# Patient Record
Sex: Male | Born: 1985 | Race: White | Hispanic: No | Smoking: Never smoker
Health system: Southern US, Community
[De-identification: ages and names within clinical notes are randomized; demographics above are authoritative.]

## PROBLEM LIST (undated history)

## (undated) DIAGNOSIS — F32A Depression, unspecified: Secondary | ICD-10-CM

## (undated) DIAGNOSIS — F329 Major depressive disorder, single episode, unspecified: Secondary | ICD-10-CM

## (undated) DIAGNOSIS — F840 Autistic disorder: Secondary | ICD-10-CM

## (undated) DIAGNOSIS — R569 Unspecified convulsions: Secondary | ICD-10-CM

## (undated) DIAGNOSIS — R269 Unspecified abnormalities of gait and mobility: Secondary | ICD-10-CM

## (undated) DIAGNOSIS — Z993 Dependence on wheelchair: Secondary | ICD-10-CM

## (undated) DIAGNOSIS — J302 Other seasonal allergic rhinitis: Secondary | ICD-10-CM

## (undated) DIAGNOSIS — K5900 Constipation, unspecified: Secondary | ICD-10-CM

## (undated) DIAGNOSIS — F8189 Other developmental disorders of scholastic skills: Secondary | ICD-10-CM

## (undated) DIAGNOSIS — F419 Anxiety disorder, unspecified: Secondary | ICD-10-CM

## (undated) HISTORY — DX: Unspecified convulsions: R56.9

## (undated) HISTORY — PX: CIRCUMCISION: SUR203

---
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
## 2012-12-27 ENCOUNTER — Other Ambulatory Visit: Payer: Self-pay

## 2012-12-27 DIAGNOSIS — G40209 Localization-related (focal) (partial) symptomatic epilepsy and epileptic syndromes with complex partial seizures, not intractable, without status epilepticus: Secondary | ICD-10-CM

## 2012-12-27 MED ORDER — CARBATROL 300 MG PO CP12: ORAL_CAPSULE | ORAL | Status: DC

## 2012-12-27 MED ORDER — CLONIDINE HCL 0.1 MG PO TABS: ORAL_TABLET | ORAL | Status: DC

## 2012-12-27 MED ORDER — CARBAMAZEPINE ER 300 MG PO CP12: ORAL_CAPSULE | ORAL | Status: DC

## 2012-12-27 NOTE — Telephone Encounter (Signed)
Please let him know that generic Carbatrol is ok if that is what the patient has been taking. I have updated the chart. Thanks, Inetta Fermo

## 2012-12-27 NOTE — Telephone Encounter (Signed)
I called and informed [name] that it was ok.

## 2012-12-27 NOTE — Telephone Encounter (Signed)
[name], the pharmacist from [facility], lvm stating that pt received the generic brand of Carbatrol last month. He said that if provider wants pt to get the brand name it needs to be hand written on the Rx. He would like a call back to clarify at [phone].

## 2012-12-27 NOTE — Addendum Note (Signed)
Addended by: Princella Ion on: 12/27/2012 11:47 AM   Modules accepted: Orders

## 2013-05-30 ENCOUNTER — Other Ambulatory Visit: Payer: Self-pay

## 2013-05-30 DIAGNOSIS — G40209 Localization-related (focal) (partial) symptomatic epilepsy and epileptic syndromes with complex partial seizures, not intractable, without status epilepticus: Secondary | ICD-10-CM

## 2013-05-30 MED ORDER — CARBAMAZEPINE ER 300 MG PO CP12: ORAL_CAPSULE | ORAL | Status: DC

## 2013-06-09 ENCOUNTER — Other Ambulatory Visit: Payer: Self-pay

## 2013-06-09 DIAGNOSIS — G40209 Localization-related (focal) (partial) symptomatic epilepsy and epileptic syndromes with complex partial seizures, not intractable, without status epilepticus: Secondary | ICD-10-CM

## 2013-06-09 MED ORDER — SERTRALINE HCL 100 MG PO TABS: ORAL_TABLET | ORAL | Status: DC

## 2013-06-09 MED ORDER — CLONIDINE HCL 0.1 MG PO TABS: ORAL_TABLET | ORAL | Status: DC

## 2013-07-12 ENCOUNTER — Other Ambulatory Visit: Payer: Self-pay

## 2013-07-12 DIAGNOSIS — G40209 Localization-related (focal) (partial) symptomatic epilepsy and epileptic syndromes with complex partial seizures, not intractable, without status epilepticus: Secondary | ICD-10-CM

## 2013-07-12 MED ORDER — SERTRALINE HCL 100 MG PO TABS: ORAL_TABLET | ORAL | Status: DC

## 2013-07-12 MED ORDER — CLONIDINE HCL 0.1 MG PO TABS: ORAL_TABLET | ORAL | Status: DC

## 2013-07-26 DIAGNOSIS — F840 Autistic disorder: Secondary | ICD-10-CM

## 2013-07-26 DIAGNOSIS — Z79899 Other long term (current) drug therapy: Secondary | ICD-10-CM

## 2013-07-26 DIAGNOSIS — G40209 Localization-related (focal) (partial) symptomatic epilepsy and epileptic syndromes with complex partial seizures, not intractable, without status epilepticus: Secondary | ICD-10-CM

## 2013-07-26 DIAGNOSIS — R269 Unspecified abnormalities of gait and mobility: Secondary | ICD-10-CM | POA: Insufficient documentation

## 2013-08-15 ENCOUNTER — Other Ambulatory Visit: Payer: Self-pay

## 2013-08-15 DIAGNOSIS — G40209 Localization-related (focal) (partial) symptomatic epilepsy and epileptic syndromes with complex partial seizures, not intractable, without status epilepticus: Secondary | ICD-10-CM

## 2013-08-16 ENCOUNTER — Ambulatory Visit (INDEPENDENT_AMBULATORY_CARE_PROVIDER_SITE_OTHER): Payer: Medicaid Other | Admitting: Pediatrics

## 2013-08-16 ENCOUNTER — Encounter: Payer: Self-pay | Admitting: Pediatrics

## 2013-08-16 VITALS — BP 100/70 | HR 96 | Ht 67.25 in | Wt 117.8 lb

## 2013-08-16 DIAGNOSIS — F840 Autistic disorder: Secondary | ICD-10-CM

## 2013-08-16 DIAGNOSIS — G40209 Localization-related (focal) (partial) symptomatic epilepsy and epileptic syndromes with complex partial seizures, not intractable, without status epilepticus: Secondary | ICD-10-CM

## 2013-08-16 DIAGNOSIS — F411 Generalized anxiety disorder: Secondary | ICD-10-CM

## 2013-08-16 MED ORDER — CLONIDINE HCL 0.1 MG PO TABS: ORAL_TABLET | ORAL | Status: DC

## 2013-08-16 MED ORDER — SERTRALINE HCL 100 MG PO TABS: ORAL_TABLET | ORAL | Status: DC

## 2013-08-16 MED ORDER — CARBAMAZEPINE ER 300 MG PO CP12: ORAL_CAPSULE | ORAL | Status: DC

## 2013-08-16 NOTE — Progress Notes (Signed)
Patient: Roberto GarlandMitchell Danowski MRN: [id number] Sex: male DOB: [birth year]  Provider: Deetta PerlaHICKLING,Quanetta Truss H, MD [facility]  Note type: Routine return visit  History of Present Illness: Referral Source: Mauricio Poegina York, NP [facility] Chief Complaint: Seizures/Autism  Roberto Ramirez is a 28 y.o. male who returns for evaluation of management of autism and complex partial seizures.  The patient returns on August 16, 2013, for the first time since May 10, 2012.  He has complex partial seizures and undifferentiated autism.  He has taken and tolerated Carbatrol well.  He attends school called [employer] where he has one to one Merchandiser, retailCAPS worker.  [place]  He is there six hours a day.  He has good appetite, fairly normal sleep patterns.  His health has been good.  He has environmental allergies.  He is on a variety of medications to deal with anxiety and changes in mood.  He is also on a large variety of multivitamins.  He has constipation treated with MiraLax and the acne treated with Differin.  His family has difficulty getting him to drink fluid.  He is quite thin and I suspect has fairly limited appetite.  The patient enjoys playing with an iPad with visually stimulating applications.  He has little to no language.  Though he has problems with anxiety with transitions he has not been physically aggressive.  He enjoys repetitive activities such as opening and closing doors and turning on and off lights.  There have been no substantial changes in his mood or behavior.  Review of Systems: 12 system review was unremarkable  Past Medical History  Diagnosis Date  . 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  His last known seizure was in 2003.  MRI scan of the brain showed slight decrease in myelinization for age, but no other specific findings of disorders of brain formation.  I do not know where this took place.  Medications in the past have included Depakene, Dilantin, and Diamox before carbamazepine..  Birth History 7 lbs. 9 oz. born at full-term to a 26 year old primigravida after an uncomplicated pregnancy.   Mother had dental films during gestation which was otherwise uncomplicated.   Labor lasted for 26 hours. He had a normal spontaneous vaginal delivery. At birth he had some cold stress but no other history of problems.   He was breast-fed for 8 months.   He had developmental delay in his gross and fine motor milestones but in particular language and socialization.  Behavior History none  Surgical History History reviewed. No pertinent past surgical history.  Family History family history includes Lung cancer in his paternal grandfather. Family History is negative migraines, seizures, cognitive impairment, blindness, deafness, birth defects, chromosomal disorder, autism.  Social History History   Social History  . Marital Status: Unknown    Spouse Name: N/A    Number of Children: N/A  . Years of Education: N/A   Social History Main Topics  . Smoking status: Never Smoker   . Smokeless tobacco: Never Used  . Alcohol Use: No  . Drug Use: No  . Sexual Activity: No   Other Topics Concern  . None   Social History Narrative  .  None   Educational level 12th special education Living with Amy Young his AFL provider  Hobbies/Interest: Enjoys socializing and going for walks outdoors. School comments [employer] graduated with a certificate from [employer] in 2011.  Current Outpatient Prescriptions on File Prior to Visit  Medication Sig Dispense Refill  . carbamazepine (CARBATROL) 300 MG 12 hr capsule Take 2 caps by mouth twice  daily  120 capsule  0  . cloNIDine (CATAPRES) 0.1 MG tablet Take 1/2 tab by mouth three times daily and take 1 tab by mouth every evening at 9:00 pm  75 tablet  5  . sertraline (ZOLOFT) 100 MG tablet Take 1 tab by mouth every morning.  30 tablet  5   No current facility-administered medications on file prior to visit.   The medication list was reviewed and reconciled. All changes or newly prescribed medications were explained.  A complete medication list was provided to the patient/caregiver.  No Known Allergies  Physical Exam BP 100/70  Pulse 96  Ht 5' 7.25" (1.708 m)  Wt 117 lb 12.8 oz (53.434 kg)  BMI 18.32 kg/m2  General: alert, well developed, well nourished, in no acute distress, sandy hair, brown eyes, right handed Head: normocephalic, no dysmorphic features Ears, Nose and Throat: Unable to examine Neck: supple, full range of motion, no cranial or cervical bruits Respiratory: auscultation clear Cardiovascular: no murmurs, pulses are normal Musculoskeletal: no skeletal deformities or apparent scoliosis Skin: no rashes or neurocutaneous lesions  Neurologic Exam  Mental Status: alert; he shows cognitive impairment and limited language, poor eye contact and resists examination.  He was restless and pacing. Cranial Nerves: visual fields are full to double simultaneous stimuli; extraocular movements are full and conjugate; pupils are around reactive to light; funduscopic examination shows positive red reflex; symmetric facial strength; midline tongue and uvula; air conduction is greater than bone conduction bilaterally. Motor: Normal strength, tone and mass; good fine motor movements; no pronator drift. Sensory: intact responses to cold, vibration, proprioception and stereognosis Coordination: He can hold and transfer objects; he has clumsiness in fine motor movements. Gait and Station: Slightly broad-based but stable gait and station: patient is able to walk on heels, toes and  tandem without difficulty; balance is adequate; Romberg exam is negative; Gower response is negative Reflexes: symmetric and diminished bilaterally; no clonus; bilateral flexor plantar responses.  Assessment 1. 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 2. Autism spectrum disorder, 299.00. 3. Anxiety state, 300.00.  Plan I am not going to change any of his medications.  Prescriptions were issued for sertraline, clonidine, and carbamazepine XR.  I will plan to see him in a year, but will see him sooner depending upon clinical need.    I spent 30 minutes of face-to-face time with the patient and his mother more than half of it in consultation.  Deetta Perla MD

## 2013-08-20 ENCOUNTER — Encounter: Payer: Self-pay | Admitting: Pediatrics

## 2013-10-27 ENCOUNTER — Other Ambulatory Visit: Payer: Self-pay

## 2013-10-27 DIAGNOSIS — G40209 Localization-related (focal) (partial) symptomatic epilepsy and epileptic syndromes with complex partial seizures, not intractable, without status epilepticus: Secondary | ICD-10-CM

## 2013-10-27 MED ORDER — CARBAMAZEPINE ER 300 MG PO CP12
ORAL_CAPSULE | ORAL | Status: DC
Start: 2013-10-27 — End: 2014-06-05

## 2014-03-29 ENCOUNTER — Other Ambulatory Visit: Payer: Self-pay | Admitting: Family

## 2014-03-29 DIAGNOSIS — F840 Autistic disorder: Secondary | ICD-10-CM

## 2014-03-29 DIAGNOSIS — F411 Generalized anxiety disorder: Secondary | ICD-10-CM

## 2014-03-29 MED ORDER — CLONIDINE HCL 0.1 MG PO TABS: ORAL_TABLET | ORAL | Status: DC

## 2014-06-05 ENCOUNTER — Other Ambulatory Visit: Payer: Self-pay

## 2014-06-05 DIAGNOSIS — G40209 Localization-related (focal) (partial) symptomatic epilepsy and epileptic syndromes with complex partial seizures, not intractable, without status epilepticus: Secondary | ICD-10-CM

## 2014-06-05 MED ORDER — CARBAMAZEPINE ER 300 MG PO CP12: ORAL_CAPSULE | ORAL | Status: DC

## 2014-09-07 ENCOUNTER — Other Ambulatory Visit: Payer: Self-pay

## 2014-09-07 DIAGNOSIS — G40209 Localization-related (focal) (partial) symptomatic epilepsy and epileptic syndromes with complex partial seizures, not intractable, without status epilepticus: Secondary | ICD-10-CM

## 2014-09-07 MED ORDER — CARBAMAZEPINE ER 300 MG PO CP12: ORAL_CAPSULE | ORAL | Status: DC

## 2014-10-02 ENCOUNTER — Other Ambulatory Visit: Payer: Self-pay

## 2014-10-02 DIAGNOSIS — F411 Generalized anxiety disorder: Secondary | ICD-10-CM

## 2014-10-02 DIAGNOSIS — F840 Autistic disorder: Secondary | ICD-10-CM

## 2014-10-02 MED ORDER — CLONIDINE HCL 0.1 MG PO TABS: ORAL_TABLET | ORAL | Status: DC

## 2014-10-18 ENCOUNTER — Ambulatory Visit (INDEPENDENT_AMBULATORY_CARE_PROVIDER_SITE_OTHER): Payer: Medicaid Other | Admitting: Pediatrics

## 2014-10-18 ENCOUNTER — Encounter: Payer: Self-pay | Admitting: Pediatrics

## 2014-10-18 VITALS — BP 106/70 | HR 84 | Ht 67.0 in | Wt 117.0 lb

## 2014-10-18 DIAGNOSIS — F840 Autistic disorder: Secondary | ICD-10-CM | POA: Diagnosis not present

## 2014-10-18 DIAGNOSIS — G40209 Localization-related (focal) (partial) symptomatic epilepsy and epileptic syndromes with complex partial seizures, not intractable, without status epilepticus: Secondary | ICD-10-CM | POA: Diagnosis not present

## 2014-10-18 DIAGNOSIS — R269 Unspecified abnormalities of gait and mobility: Secondary | ICD-10-CM | POA: Diagnosis not present

## 2014-10-18 DIAGNOSIS — F802 Mixed receptive-expressive language disorder: Secondary | ICD-10-CM | POA: Diagnosis not present

## 2014-10-18 DIAGNOSIS — F411 Generalized anxiety disorder: Secondary | ICD-10-CM

## 2014-10-18 MED ORDER — CARBAMAZEPINE ER 300 MG PO CP12: ORAL_CAPSULE | ORAL | Status: DC

## 2014-10-18 MED ORDER — CLONIDINE HCL 0.1 MG PO TABS: ORAL_TABLET | ORAL | Status: DC

## 2014-10-18 MED ORDER — SERTRALINE HCL 100 MG PO TABS: ORAL_TABLET | ORAL | Status: DC

## 2014-10-18 NOTE — Progress Notes (Signed)
Patient: Roberto Ramirez MRN: [id number] Sex: male DOB: Dec 16, 1985  Provider: Deetta PerlaHICKLING,Rock Sobol H, MD [facility]  Note type: Routine return visit  History of Present Illness: Referral Source: Mauricio Poegina York, NP [facility]: [facility] Chief Complaint: Seizures/Autism Spectrum Disorder  Roberto Ramirez is a 29 y.o. male who was evaluated on October 18, 2014, for the first time since August 16, 2013.  He has autism spectrum disorder with intellectual and language impairment.  He has a history of well-controlled complex partial seizures and is taken and tolerated Carbatrol without significant side effects.  He attends an adult day care center called [employer] and has one to one CAPS worker.  He is there for about six hours a day.  Many of the goals at this center are self-help skills including brushing his teeth, feeding himself with a spoon and a fork open cup and helping to dress himself.  He needs help with hygiene.  He was here today with his guardian.  I think the only medications that he is taking that I prescribe are Carbatrol and clonidine.  He has been given clonazepam when he shows increased anxiety, which has caused him to at times act strange and be unable to walk.  Review of Systems: 12 system review was remarkable for anxiety  Past Medical History Diagnosis Date  . Seizures    Hospitalizations: No., Head Injury: No., Nervous System Infections: No., Immunizations up to date: Yes.    ER visit during the summer of 2015 due to anxiety attack.   Evaluation in the past includes routine karyotype 8046 XY, negative fragile X syndrome by molecular PCR, normal TSH, and normal CT scan of the brain July 21, 2000. His last known seizure was in 2003. MRI scan of the brain showed slight decrease in myelinization for age, but no other specific findings of disorders of brain formation. I do not know where this took place. Medications in  the past have included Depakene, Dilantin, and Diamox before carbamazepine.  Birth History 7 lbs. 9 oz. born at full-term to a 27 year old primigravida after an uncomplicated pregnancy.  Mother had dental films during gestation which was otherwise uncomplicated.  Labor lasted for 26 hours. He had a normal spontaneous vaginal delivery. At birth he had some cold stress but no other history of problems.  He was breast-fed for 8 months.  He had developmental delay in his gross and fine motor milestones but in particular language and socialization.  Behavior History none  Surgical History Procedure Laterality Date  . Circumcision  1989   Family History family history includes Cancer in his maternal grandmother; Lung cancer in his paternal grandfather. Family history is negative for migraines, seizures, intellectual disabilities, blindness, deafness, birth defects, chromosomal disorder, or autism.  Social History . Marital Status: Unknown    Spouse Name: N/A  . Number of Children: N/A  . Years of Education: N/A   Social History Main Topics  . Smoking status: Never Smoker   . Smokeless tobacco: Never Used  . Alcohol Use: No  . Drug Use: No  . Sexual Activity: No   Social History Narrative  Educational level special education Living with Amy Young his legal guardian   Hobbies/Interest: Enjoys walking, kickball and swimming.   No Known Allergies  Physical Exam BP 106/70 mmHg  Pulse 84  Ht 5\' 7"  (1.702 m)  Wt 117 lb (53.071 kg)  BMI 18.32 kg/m2  General: alert, well developed, well nourished, in  no acute distress, sandy hair, brown eyes, right handed Head: normocephalic, no dysmorphic features Ears, Nose and Throat: Unable to examine Neck: supple, full range of motion, no cranial or cervical bruits Respiratory: auscultation clear Cardiovascular: no murmurs, pulses are normal Musculoskeletal: no skeletal deformities or apparent scoliosis Skin: no rashes or  neurocutaneous lesions  Neurologic Exam  Mental Status: alert; he shows intellectual disability and limited language, poor eye contact and resists examination. He was restless and pacing. Cranial Nerves: visual fields are full to double simultaneous stimuli; extraocular movements are full and conjugate; pupils are round reactive to light; funduscopic examination shows positive red reflex; symmetric facial strength; midline tongue and uvula; air conduction is greater than bone conduction bilaterally. Motor: Normal strength, tone and mass; good fine motor movements; no pronator drift. Sensory: intact responses to cold, vibration, proprioception and stereognosis Coordination: He can hold and transfer objects; he has clumsiness in fine motor movements. Gait and Station: Slightly broad-based but stable gait and station: patient is able to walk on heels, toes and tandem without difficulty; balance is adequate; Romberg exam is negative; Gower response is negative Reflexes: symmetric and diminished bilaterally; no clonus; bilateral flexor plantar responses.  Assessment 1. Partial epilepsy with impairment of consciousness, G40.209. 2. Autism spectrum disorder with accompanying intellectual impairment, requiring substantial support (level 2), F84.0. 3. Receptive-expressive language disorder, F80.2. 4. Abnormality of gait, R26.9. 5. Anxiety state, F41.1.  Discussion The patient is physically and neurologically stable.  I am pleased that his seizures are under control.  I think that it has been difficult to modify his behaviors.  I think that the best influence upon him is his adult daycare center.  Plan I refilled prescriptions for extended release carbamazepine.  I did not write prescriptions for clonidine or sertraline.  Overall, I am pleased with his health and the fact that his development has been stable.  He will return to see me in a year.  I will make certain that he has a prescription refills  in the interim.  I spent 30 minutes of face-to-face time with the patient and his caregiver, more than half was in consultation.   Medication List   This list is accurate as of: 10/18/14 12:23 PM.       adapalene 0.1 % cream  Commonly known as:  DIFFERIN  Apply topically at bedtime.     b complex vitamins capsule  Take 1 capsule by mouth daily.     busPIRone 10 MG tablet  Commonly known as:  BUSPAR  Take 10 mg by mouth 2 (two) times daily.     carbamazepine 300 MG 12 hr capsule  Commonly known as:  CARBATROL  Take 2 caps by mouth twice daily     cetirizine 10 MG chewable tablet  Commonly known as:  ZYRTEC  Chew 10 mg by mouth daily.     clonazePAM 0.5 MG tablet  Commonly known as:  KLONOPIN  Take 0.5 mg by mouth 3 (three) times daily. Take 1/2 tab by mouth TID.     cloNIDine 0.1 MG tablet  Commonly known as:  CATAPRES  Take 1/2 tab by mouth three times daily and take 1 tab by mouth every evening at 9:00 pm     fluticasone 27.5 MCG/SPRAY nasal spray  Commonly known as:  VERAMYST  Place 2 sprays into the nose daily. Each nostril     Lecithin 400 MG Caps  Take 1 capsule by mouth daily.     multivitamin tablet  Take  1 tablet by mouth daily.     polyethylene glycol packet  Commonly known as:  MIRALAX / GLYCOLAX  Take 17 g by mouth daily.     sertraline 100 MG tablet  Commonly known as:  ZOLOFT  Take 1 1/2 tab by mouth every morning.      The medication list was reviewed and reconciled. All changes or newly prescribed medications were explained.  A complete medication list was provided to the patient/caregiver.  Deetta Perla MD

## 2015-05-15 ENCOUNTER — Other Ambulatory Visit: Payer: Self-pay

## 2015-05-15 DIAGNOSIS — G40209 Localization-related (focal) (partial) symptomatic epilepsy and epileptic syndromes with complex partial seizures, not intractable, without status epilepticus: Secondary | ICD-10-CM

## 2015-05-15 MED ORDER — CARBAMAZEPINE ER 300 MG PO CP12: ORAL_CAPSULE | ORAL | Status: DC

## 2015-06-11 ENCOUNTER — Other Ambulatory Visit: Payer: Self-pay | Admitting: Family

## 2015-06-11 ENCOUNTER — Other Ambulatory Visit: Payer: Self-pay

## 2015-06-11 DIAGNOSIS — F411 Generalized anxiety disorder: Secondary | ICD-10-CM

## 2015-06-11 DIAGNOSIS — G40209 Localization-related (focal) (partial) symptomatic epilepsy and epileptic syndromes with complex partial seizures, not intractable, without status epilepticus: Secondary | ICD-10-CM

## 2015-06-11 DIAGNOSIS — F840 Autistic disorder: Secondary | ICD-10-CM

## 2015-06-11 MED ORDER — CARBAMAZEPINE ER 300 MG PO CP12: ORAL_CAPSULE | ORAL | Status: DC

## 2015-06-11 MED ORDER — CLONIDINE HCL 0.1 MG PO TABS: ORAL_TABLET | ORAL | Status: DC

## 2015-11-14 ENCOUNTER — Other Ambulatory Visit: Payer: Self-pay | Admitting: Family

## 2015-11-19 ENCOUNTER — Encounter: Payer: Self-pay | Admitting: Pediatrics

## 2015-11-19 ENCOUNTER — Ambulatory Visit (INDEPENDENT_AMBULATORY_CARE_PROVIDER_SITE_OTHER): Payer: Medicaid Other | Admitting: Pediatrics

## 2015-11-19 ENCOUNTER — Other Ambulatory Visit: Payer: Self-pay | Admitting: Family

## 2015-11-19 VITALS — BP 90/70 | HR 72 | Ht 67.0 in | Wt 121.2 lb

## 2015-11-19 DIAGNOSIS — F840 Autistic disorder: Secondary | ICD-10-CM

## 2015-11-19 DIAGNOSIS — G40209 Localization-related (focal) (partial) symptomatic epilepsy and epileptic syndromes with complex partial seizures, not intractable, without status epilepticus: Secondary | ICD-10-CM

## 2015-11-19 DIAGNOSIS — R269 Unspecified abnormalities of gait and mobility: Secondary | ICD-10-CM

## 2015-11-19 DIAGNOSIS — F802 Mixed receptive-expressive language disorder: Secondary | ICD-10-CM

## 2015-11-19 MED ORDER — CLONIDINE HCL 0.1 MG PO TABS: ORAL_TABLET | ORAL | Status: DC

## 2015-11-19 MED ORDER — CARBAMAZEPINE ER 300 MG PO CP12: 600.0000 mg | ORAL_CAPSULE | Freq: Two times a day (BID) | ORAL | Status: DC

## 2015-11-19 NOTE — Progress Notes (Signed)
Patient: Roberto Ramirez MRN: [id number] Sex: male DOB: [birth year]  Provider: Deetta Perla, MD Location of Care: [facility]  Note type: Routine return visit  History of Present Illness: Referral Source: Mauricio Po, NP  History from: mother, patient and [facility] chart Chief Complaint: Seizures/Autism Spectrum Disorder  Roberto Ramirez is a 30 y.o. male who returns Nov 19, 2015, for the first time since October 18, 2014.  He has autism spectrum disorder with intellectual and language impairment.  He has well controlled complex partial seizures.  He takes and tolerates Carbatrol without significant side effects.  He lives with an associate family life person, Ardeth Perfect, his mother remains his guardian, but has limited contact with him.  He attended an adult daycare center called [employer] with the one-to-one CAPS worker six hours a day.  In the 13 months since I have seen him, he has been stable.  There have been no seizures.  His health is good.  He has normal sleeping habits.  He goes to breakfast with his AFL every morning.  He picks up the restaurant.  As regards to activities of daily living, he is toilet trained to some extent and is beginning to vocalize his need to use the bathroom.  He feeds himself.  He is dependent on her for dressing and undressing and for appropriate hygiene.  He goes to bed around 9:45 p.m. and has occasional arousals at nighttime and he gets up at 5 or 6 a.m.  He does not take midday naps.  Clonazepam has seemed to improve his anxiety and agitation and fortunately he has not become tolerant to it.  I prescribe extended release carbamazepine and also clonidine throughout the day, which also helps his anxiety and agitation. . Review of Systems: 12 system review was assessed and was negative  Past Medical History Diagnosis Date  . Seizures (HCC)    Hospitalizations: No., Head Injury: No., Nervous System Infections: No., Immunizations up to  date: Yes.    ER visit during the summer of 2015 due to anxiety attack.   Evaluation in the past includes routine karyotype 28 XY, negative fragile X syndrome by molecular PCR, normal TSH, and normal CT scan of the brain July 21, 2000. His last known seizure was in 2003. MRI scan of the brain showed slight decrease in myelinization for age, but no other specific findings of disorders of brain formation. I do not know where this took place. Medications in the past have included Depakene, Dilantin, and Diamox before carbamazepine.  Birth History 7 lbs. 9 oz. born at full-term to a 28 year old primigravida after an uncomplicated pregnancy.  Mother had dental films during gestation which was otherwise uncomplicated.  Labor lasted for 26 hours. He had a normal spontaneous vaginal delivery. At birth he had some cold stress but no other history of problems.  He was breast-fed for 8 months.  He had developmental delay in his gross and fine motor milestones but in particular language and socialization.  Behavior History autism spectrum disorder  Surgical History Procedure Laterality Date  . Circumcision  1989   Family History family history includes Cancer in his maternal grandmother; Lung cancer in his paternal grandfather. Family history is negative for migraines, seizures, intellectual disabilities, blindness, deafness, birth defects, chromosomal disorder, or autism.  Social History . Marital Status: Unknown    Spouse Name: N/A  . Number of Children: N/A  . Years of Education: N/A   Social History Main Topics  . Smoking  status: Never Smoker   . Smokeless tobacco: Never Used  . Alcohol Use: No  . Drug Use: No  . Sexual Activity: No   Social History Narrative  Lives with an AFL- Amy Young.  Attends an adult day care. [employer] in [place], 6 hours daily Monday through Friday.  No Known Allergies  Physical Exam BP 90/70 mmHg  Pulse 72  Ht  (1.702 m)  Wt  121 lb 3.2 oz (54.976 kg)  BMI 18.98 kg/m2  General: alert, well developed, well nourished, in no acute distress, sandy hair, brown eyes, right handed Head: normocephalic, no dysmorphic features Ears, Nose and Throat: Unable to examine Neck: supple, full range of motion, no cranial or cervical bruits Respiratory: auscultation clear Cardiovascular: no murmurs, pulses are normal Musculoskeletal: no skeletal deformities or apparent scoliosis Skin: no rashes or neurocutaneous lesions  Neurologic Exam  Mental Status: alert; he shows intellectual disability and limited language, poor eye contact and has difficulty following commands. He was able to sit in a chair during history taking without much difficulty.  He got up once to leave the room, but was able to be redirected and was fine. Cranial Nerves: visual fields are full to double simultaneous stimuli; extraocular movements are full and conjugate; pupils are round reactive to light; funduscopic examination shows positive red reflex; symmetric facial strength; midline tongue and uvula; air conduction is greater than bone conduction bilaterally. Motor: Normal functional strength, tone and mass; clumsy fine motor movements; can't test pronator drift. Sensory: intact responses to cold, vibration, proprioception and stereognosis Coordination: He can hold and transfer objects Gait and Station: Slightly broad-based but stable gait and station: patient is able to walk on heels, toes and tandem without difficulty; balance is adequate; Romberg exam is negative; Gower response is negative Reflexes: symmetric and diminished bilaterally; no clonus; bilateral flexor plantar responses  Assessment 1. Partial epilepsy with impairment of consciousness, G40.209. 2. Autism spectrum disorder with accompanying intellectual impairment requiring substantial support (level 2), F84.0. 3. Receptive-expressive language disorder, F80.2. 4. Abnormality of gait,  R26.9.  Discussion [name] is stable.  There is no reason to make any change in his current treatment.  Plan I refilled prescriptions for extended release carbamazepine and clonidine.  He will return to see me in one year.  I spent 30 minutes of face-to-face time with Roberto Ramirez and Ms. Young, more than half of it in consultation.   Medication List   This list is accurate as of: 11/19/15 11:59 PM.       b complex vitamins capsule  Take 1 capsule by mouth daily.     carbamazepine 300 MG 12 hr capsule  Commonly known as:  CARBATROL  Take 2 capsules (600 mg total) by mouth 2 (two) times daily.     cetirizine 10 MG chewable tablet  Commonly known as:  ZYRTEC  Chew 10 mg by mouth daily.     clonazePAM 0.5 MG tablet  Commonly known as:  KLONOPIN  Take 0.5 mg by mouth 3 (three) times daily. Take 1/2 tab by mouth TID.     cloNIDine 0.1 MG tablet  Commonly known as:  CATAPRES  TAKE 1/2 TABLET BY MOUTH 3 TIMES DAILY AND 1 EVERY EVENING AT 9PM     fluticasone 27.5 MCG/SPRAY nasal spray  Commonly known as:  VERAMYST  Place 2 sprays into the nose daily. Each nostril     multivitamin tablet  Take 1 tablet by mouth daily.     polyethylene glycol  packet  Commonly known as:  MIRALAX / GLYCOLAX  Take 17 g by mouth daily.     sertraline 100 MG tablet  Commonly known as:  ZOLOFT  Take 1 1/2 tab by mouth every morning.      The medication list was reviewed and reconciled. All changes or newly prescribed medications were explained.  A complete medication list was provided to the patient/caregiver.  Deetta PerlaWilliam H Manley Fason MD

## 2016-05-21 ENCOUNTER — Other Ambulatory Visit: Payer: Self-pay | Admitting: Family

## 2016-05-21 DIAGNOSIS — F840 Autistic disorder: Secondary | ICD-10-CM

## 2016-06-10 ENCOUNTER — Other Ambulatory Visit (INDEPENDENT_AMBULATORY_CARE_PROVIDER_SITE_OTHER): Payer: Self-pay | Admitting: Family

## 2016-06-10 ENCOUNTER — Other Ambulatory Visit: Payer: Self-pay | Admitting: Pediatrics

## 2016-06-10 DIAGNOSIS — G40209 Localization-related (focal) (partial) symptomatic epilepsy and epileptic syndromes with complex partial seizures, not intractable, without status epilepticus: Secondary | ICD-10-CM

## 2016-06-10 DIAGNOSIS — F840 Autistic disorder: Secondary | ICD-10-CM

## 2016-06-10 DIAGNOSIS — F411 Generalized anxiety disorder: Secondary | ICD-10-CM

## 2016-06-10 MED ORDER — CLONAZEPAM 0.5 MG PO TABS: 0.5000 mg | ORAL_TABLET | Freq: Three times a day (TID) | ORAL | 1 refills | Status: DC

## 2016-08-12 ENCOUNTER — Other Ambulatory Visit: Payer: Self-pay | Admitting: Family

## 2016-08-12 DIAGNOSIS — F411 Generalized anxiety disorder: Secondary | ICD-10-CM

## 2016-08-12 DIAGNOSIS — F840 Autistic disorder: Secondary | ICD-10-CM

## 2016-11-10 ENCOUNTER — Other Ambulatory Visit: Payer: Self-pay | Admitting: Family

## 2016-11-10 DIAGNOSIS — F840 Autistic disorder: Secondary | ICD-10-CM

## 2016-11-13 ENCOUNTER — Ambulatory Visit (INDEPENDENT_AMBULATORY_CARE_PROVIDER_SITE_OTHER): Payer: Medicaid Other | Admitting: Pediatrics

## 2016-11-13 ENCOUNTER — Encounter (INDEPENDENT_AMBULATORY_CARE_PROVIDER_SITE_OTHER): Payer: Self-pay | Admitting: Pediatrics

## 2016-11-13 VITALS — BP 120/74 | HR 100 | Ht 67.0 in | Wt 121.6 lb

## 2016-11-13 DIAGNOSIS — R269 Unspecified abnormalities of gait and mobility: Secondary | ICD-10-CM

## 2016-11-13 DIAGNOSIS — F411 Generalized anxiety disorder: Secondary | ICD-10-CM | POA: Diagnosis not present

## 2016-11-13 DIAGNOSIS — G40209 Localization-related (focal) (partial) symptomatic epilepsy and epileptic syndromes with complex partial seizures, not intractable, without status epilepticus: Secondary | ICD-10-CM | POA: Diagnosis not present

## 2016-11-13 DIAGNOSIS — R32 Unspecified urinary incontinence: Secondary | ICD-10-CM | POA: Insufficient documentation

## 2016-11-13 DIAGNOSIS — F840 Autistic disorder: Secondary | ICD-10-CM | POA: Diagnosis not present

## 2016-11-13 DIAGNOSIS — F802 Mixed receptive-expressive language disorder: Secondary | ICD-10-CM | POA: Diagnosis not present

## 2016-11-13 MED ORDER — CLONAZEPAM 0.5 MG PO TABS: ORAL_TABLET | ORAL | 5 refills | Status: DC

## 2016-11-13 MED ORDER — CARBAMAZEPINE ER 300 MG PO CP12: ORAL_CAPSULE | ORAL | 5 refills | Status: DC

## 2016-11-13 NOTE — Patient Instructions (Addendum)
I would recommend getting a urinalysis to make certain that he does not have urinary tract infection.  I think that's unlikely given that he does not have incontinence all the time.  I believe this incontinence, and his habit of sitting down on the floor when he stops is a mannerism.  Hopefully it will pass.  Trying to plan to keep him walking when you're out in public may make it easier for you to shop.  If not, you may need to get someone to stay with him so that you can shop.  I recommended a shower chair for his shower so that he can be stable in the bathtub.  I will be happy to fill out his physical examination when you send it to me.  Please sign up for My Chart said that you communicate with my office 3 years marked phone by text.

## 2016-11-13 NOTE — Progress Notes (Signed)
Patient: Roberto Ramirez MRN: [id number] Sex: male DOB: [birth year]  Provider: Ellison Carwin, MD Location of Care: [facility]  Note type: Routine return visit  History of Present Illness: Referral Source: Mauricio Po, NP History from: mother, patient and [facility] chart Chief Complaint: Seizures/Autism Spectrum Disorder  Roberto Ramirez is a 31 y.o. male who was evaluated on November 13, 2016, for the first time since Nov 19, 2015.  He returns for his yearly routine exam.  He has autism spectrum disorder-level 2, partial epilepsy with impairment of consciousness that is well controlled on Carbatrol.  Recently, his mother thinks that he has become much more anxious.  When he becomes anxious, he will grab his testicles through his pants and hold on tightly.  This is not a form of masturbation.  He does that as well, but in private.  When she is shopping and pauses, Roberto Ramirez will sit down on the floor.  She can get him back up, but it is not clear why he would do that.  This has made it difficult for her to go out shopping with him anymore.  Although as long as she is able to get him up, I do not see this as a bigger problem, if he sat on the floor and could not be easily picked up.  His seizures remain in good control.  His mother believes that he has lost his appetite, but his weight is identical with that of a year ago.  He is thin.  In addition, he started to have daytime enuresis that is infrequent.  His mother asked him to use the bathroom to urinate before he came in the room and he told her he did not have to.  He then came in the room sat down and urinated in his pants.  This is happened several times in the last few weeks, although it is not continuous.  He does not seem to be experiencing pain.  Although, I suggested to his mother that we needed to make certain that he did not have a urinary tract infection.    The other odd behavior that he has that is related to the sitting  down is that sometimes when he sits down outside the house, he will crawl on his bottom quite a distance before he will pick himself up to stand.  He is in an adult daycare called [employer] in [place].  This is going very well for him.  I do not think that he has exhibited the same problems there.  Review of Systems: 12 system review was remarkable for per mom, anxiety has increased, will not stand still, slides on the ground until he gets to the door of the house; the remainder was assessed was negative  Past Medical History Diagnosis Date  . Seizures (HCC)    Hospitalizations: No., Head Injury: No., Nervous System Infections: No., Immunizations up to date: Yes.    ER visit during the summer of 2015 due to anxiety attack.   Evaluation in the past includes routine karyotype 45 XY, negative fragile X syndrome by molecular PCR, normal TSH, and normal CT scan of the brain July 21, 2000. His last known seizure was in 2003. MRI scan of the brain showed slight decrease in myelinization for age, but no other specific findings of disorders of brain formation. I do not know where this took place. Medications in the past have included Depakene, Dilantin, and Diamox before carbamazepine.  Birth History 7 lbs.  9 oz. born at full-term to a 29 year old primigravida after an uncomplicated pregnancy.  Mother had dental films during gestation which was otherwise uncomplicated.  Labor lasted for 26 hours. He had a normal spontaneous vaginal delivery. At birth he had some cold stress but no other history of problems.  He was breast-fed for 8 months.  He had developmental delay in his gross and fine motor milestones but in particular language and socialization.  Behavior History Autism spectrum disorder (level 2)  Surgical History Procedure Laterality Date  . CIRCUMCISION  1989   Family History family history includes Cancer in his maternal grandmother; Lung cancer in his paternal  grandfather. Family history is negative for migraines, seizures, intellectual disabilities, blindness, deafness, birth defects, chromosomal disorder, or autism.  Social History . Marital status: Unknown   Social History Main Topics  . Smoking status: Never Smoker  . Smokeless tobacco: Never Used  . Alcohol use No  . Drug use: No  . Sexual activity: No   Social History Narrative    Roberto Ramirez is a 31 yo male.    He attends [employer] in [place].    He lives with his parents. He has two siblings.   No Known Allergies  Physical Exam BP 120/74   Pulse 100   Ht  (1.702 m)   Wt 121 lb 9.6 oz (55.2 kg)   BMI 19.05 kg/m   General: alert, well developed, well nourished, in no acute distress, sandy hair, brown eyes, right handed Head: normocephalic, no dysmorphic features Ears, Nose and Throat: Otoscopic: tympanic membranes normal; pharynx: oropharynx is pink without exudates or tonsillar hypertrophy Neck: supple, full range of motion, no cranial or cervical bruits Respiratory: auscultation clear Cardiovascular: no murmurs, pulses are normal Musculoskeletal: no skeletal deformities or apparent scoliosis Skin: no rashes or neurocutaneous lesions  Neurologic Exam  Mental Status: alert; he makes poor eye contact, has difficulty following commands, and has limited language he sat on the floor for much of the visit but it was easy to get him up to have him walk Cranial Nerves: visual fields are full to double simultaneous stimuli; extraocular movements are full and conjugate; pupils are round reactive to light; funduscopic examination shows sharp disc margins with normal vessels; symmetric facial strength; midline tongue and uvula; air conduction is greater than bone conduction bilaterally Motor: normal functional strength, tone and mass; clumsy fine motor movements Sensory: withdrawal 4 Coordination: no tremor, otherwise unable to test adequately Gait and Station: slightly  broad-based gait and station; Gower response is negative Reflexes: symmetric and diminished bilaterally; no clonus; bilateral flexor plantar responses  Assessment 1. Partial epilepsy with impairment of consciousness, G40.209. 2. Autism spectrum disorder with accompanying intellectual impairment, requiring substantial support (level 2), F84.0. 3. Mixed receptive-expressive language disorder, F80.2. 4. Abnormality of gait, G26.9. 5. Urinary incontinence, unspecified, R32. 6. Anxiety state, F41.1.  Discussion I am not certain why he is having these new behaviors of sitting and urinary incontinence.  We need to make certain that he does not have a urinary tract infection.    Plan I asked his mother to contact his primary physician about getting a urine analysis.  If there is any problem, we could order that from this office.  As I stated above, I am not certain that his sitting in a public place makes it so that she cannot take him shopping other than for the issue with embarrassment.  I am pleased that his seizures are under control.  I did not change his extended release carbamazepine capsule neither did I change his clonidine.  He will return to see me in one year's time.  I will be happy to see him sooner depending upon clinical need.  I told his mother to contact me for further questions or changes in his behavior.  I do not think that there is anything neurologic that explains his incontinence and sitting.   Medication List   Accurate as of 11/13/16  9:49 AM.      b complex vitamins capsule Take 1 capsule by mouth daily.   carbamazepine 300 MG 12 hr capsule Commonly known as:  CARBATROL TAKE TWO (2) CAPSULES BY MOUTH TWICE A DAY. (BLACK/TEAL CAPSULE)   cetirizine 10 MG chewable tablet Commonly known as:  ZYRTEC Chew 10 mg by mouth daily.   clonazePAM 0.5 MG tablet Commonly known as:  KLONOPIN TAKE ONE HALF (1/2) TABLET BY MOUTH THREE (3) TIMES A DAY. (HALF YELLOW TABLET)     cloNIDine 0.1 MG tablet Commonly known as:  CATAPRES TAKE ONE HALF (1/2) TABLET BY MOUTH 3 TIMES DAILY AND 1 TABLET EVERY EVENING AT 9PM. (ROUND PINK TAB OR YELLOW TAB)   fluticasone 27.5 MCG/SPRAY nasal spray Commonly known as:  VERAMYST Place 2 sprays into the nose daily. Each nostril   multivitamin tablet Take 1 tablet by mouth daily.   polyethylene glycol packet Commonly known as:  MIRALAX / GLYCOLAX Take 17 g by mouth daily.   sertraline 100 MG tablet Commonly known as:  ZOLOFT Take 1 1/2 tab by mouth every morning.    The medication list was reviewed and reconciled. All changes or newly prescribed medications were explained.  A complete medication list was provided to the patient/caregiver.  Deetta Perla MD

## 2016-12-01 ENCOUNTER — Ambulatory Visit (INDEPENDENT_AMBULATORY_CARE_PROVIDER_SITE_OTHER): Payer: Medicaid Other | Admitting: Pediatrics

## 2016-12-16 ENCOUNTER — Other Ambulatory Visit: Payer: Self-pay | Admitting: Family

## 2016-12-16 DIAGNOSIS — F840 Autistic disorder: Secondary | ICD-10-CM

## 2017-06-16 ENCOUNTER — Other Ambulatory Visit: Payer: Self-pay | Admitting: Family

## 2017-06-16 DIAGNOSIS — F840 Autistic disorder: Secondary | ICD-10-CM

## 2017-06-17 ENCOUNTER — Other Ambulatory Visit (INDEPENDENT_AMBULATORY_CARE_PROVIDER_SITE_OTHER): Payer: Self-pay | Admitting: Pediatrics

## 2017-06-17 ENCOUNTER — Telehealth (INDEPENDENT_AMBULATORY_CARE_PROVIDER_SITE_OTHER): Payer: Self-pay | Admitting: Pediatrics

## 2017-06-17 DIAGNOSIS — G40209 Localization-related (focal) (partial) symptomatic epilepsy and epileptic syndromes with complex partial seizures, not intractable, without status epilepticus: Secondary | ICD-10-CM

## 2017-06-17 NOTE — Telephone Encounter (Signed)
°  Who's calling (name and relationship to patient) : Boneta LucksJenny Scientist, research (physical sciences)(Pharmacist) Best contact number: [phone] Provider they see: Dr. Sharene SkeansHickling Reason for call: Boneta LucksJenny needs clarification for Carbatrol script. She states the script normally says ER 300 mg.

## 2017-06-17 NOTE — Telephone Encounter (Signed)
Spoke with Boneta LucksJenny and explained that the ER is the 12 hour release

## 2017-12-16 ENCOUNTER — Other Ambulatory Visit: Payer: Self-pay | Admitting: Family

## 2017-12-16 DIAGNOSIS — F840 Autistic disorder: Secondary | ICD-10-CM

## 2017-12-21 ENCOUNTER — Other Ambulatory Visit (INDEPENDENT_AMBULATORY_CARE_PROVIDER_SITE_OTHER): Payer: Self-pay | Admitting: Pediatrics

## 2017-12-21 DIAGNOSIS — G40209 Localization-related (focal) (partial) symptomatic epilepsy and epileptic syndromes with complex partial seizures, not intractable, without status epilepticus: Secondary | ICD-10-CM

## 2018-06-15 ENCOUNTER — Other Ambulatory Visit: Payer: Self-pay | Admitting: Family

## 2018-06-15 ENCOUNTER — Other Ambulatory Visit (INDEPENDENT_AMBULATORY_CARE_PROVIDER_SITE_OTHER): Payer: Self-pay | Admitting: Pediatrics

## 2018-06-15 DIAGNOSIS — F840 Autistic disorder: Secondary | ICD-10-CM

## 2018-06-15 MED ORDER — CLONIDINE HCL 0.1 MG PO TABS: ORAL_TABLET | ORAL | 5 refills | Status: DC

## 2018-06-15 NOTE — Telephone Encounter (Signed)
Rx has been sent electronically to the pharmacy 

## 2018-06-15 NOTE — Telephone Encounter (Signed)
°  Who's calling (name and relationship to patient) : Ardeth PerfectAmy Young (DPR On file)   Best contact number: [phone]  Provider they see: Dr. Sharene SkeansHickling   Reason for call: RX Refill     PRESCRIPTION REFILL ONLY  Name of prescription: Catapres  [facility]([place])

## 2018-06-24 ENCOUNTER — Ambulatory Visit (INDEPENDENT_AMBULATORY_CARE_PROVIDER_SITE_OTHER): Payer: Self-pay | Admitting: Pediatrics

## 2018-07-01 ENCOUNTER — Other Ambulatory Visit (INDEPENDENT_AMBULATORY_CARE_PROVIDER_SITE_OTHER): Payer: Self-pay | Admitting: Pediatrics

## 2018-07-01 DIAGNOSIS — G40209 Localization-related (focal) (partial) symptomatic epilepsy and epileptic syndromes with complex partial seizures, not intractable, without status epilepticus: Secondary | ICD-10-CM

## 2018-08-09 ENCOUNTER — Ambulatory Visit (INDEPENDENT_AMBULATORY_CARE_PROVIDER_SITE_OTHER): Payer: Medicaid Other | Admitting: Pediatrics

## 2018-08-09 ENCOUNTER — Encounter (INDEPENDENT_AMBULATORY_CARE_PROVIDER_SITE_OTHER): Payer: Self-pay | Admitting: Pediatrics

## 2018-08-09 VITALS — BP 108/74 | HR 72 | Ht 67.0 in | Wt 117.4 lb

## 2018-08-09 DIAGNOSIS — G40209 Localization-related (focal) (partial) symptomatic epilepsy and epileptic syndromes with complex partial seizures, not intractable, without status epilepticus: Secondary | ICD-10-CM

## 2018-08-09 DIAGNOSIS — F411 Generalized anxiety disorder: Secondary | ICD-10-CM

## 2018-08-09 DIAGNOSIS — R269 Unspecified abnormalities of gait and mobility: Secondary | ICD-10-CM

## 2018-08-09 DIAGNOSIS — F840 Autistic disorder: Secondary | ICD-10-CM | POA: Diagnosis not present

## 2018-08-09 MED ORDER — CARBAMAZEPINE ER 300 MG PO CP12: ORAL_CAPSULE | ORAL | 5 refills | Status: DC

## 2018-08-09 MED ORDER — CLONIDINE HCL 0.1 MG PO TABS: ORAL_TABLET | ORAL | 5 refills | Status: DC

## 2018-08-09 MED ORDER — CLONAZEPAM 0.5 MG PO TABS: ORAL_TABLET | ORAL | 5 refills | Status: DC

## 2018-08-09 NOTE — Patient Instructions (Signed)
I am pleased that Meba is seizure-free.  It turns out that I not only prescribe Carbatrol, but also clonidine and clonazepam.

## 2018-08-09 NOTE — Progress Notes (Signed)
Patient: Roberto Ramirez MRN: [id number] Sex: male DOB: [birth year]  Provider: Ellison CarwinWilliam Reiana Poteet, MD [facility]  Note type: Routine return visit  History of Present Illness: Referral Source: Mauricio Poegina York, NP History from: mother, [facility] and [facility] Chief Complaint: Seizures/Autism Spectrum Disorder  Roberto ChurnMitchell H Yaden is a 33 y.o. male who returns on August 09, 2018 for the first time since November 13, 2016.  The patient has autism spectrum disorder, level 2, focal epilepsy with impairment of consciousness that is well controlled, and problems with anxiety.    Since his last visit, there have been no seizures.  He has switched from sertraline to Lexapro, which has helped his mood and behavior.  On his last visit, there were concerns with daytime enuresis that have stopped.  His sleeping has improved and he is not showing as many behaviors where he will suddenly stop and not move.  He still will occasionally grab his groin and hold on tightly.  This seems to be less because he is, in general, less anxious.  He has been sick in the recent past with a virus from Friday through Monday with some low-grade fever and anorexia.  He attends [facility] [employer] in [place] and is doing well.  Review of Systems: A complete review of systems was assessed and was negative.  Past Medical History Diagnosis Date  . Seizures (HCC)    Hospitalizations: No., Head Injury: No., Nervous System Infections: No., Immunizations up to date: Yes.    ER visit during the summer of 2015 due to anxiety attack.   Evaluation in the past includes routine karyotype 5546 XY, negative fragile X syndrome by molecular PCR, normal TSH, and normal CT scan of the brain July 21, 2000. His last known seizure was in 2003. MRI scan of the brain showed slight decrease in myelinization for age, but no other specific findings of disorders of brain formation. I do not know where this  took place. Medications in the past have included Depakene, Dilantin, and Diamox before carbamazepine.  Birth History 7 lbs. 9 oz. born at full-term to a 31 year old primigravida after an uncomplicated pregnancy.  Mother had dental films during gestation which was otherwise uncomplicated.  Labor lasted for 26 hours. He had a normal spontaneous vaginal delivery. At birth he had some cold stress but no other history of problems.  He was breast-fed for 8 months.  He had developmental delay in his gross and fine motor milestones but in particular language and socialization.  Behavior History Autism spectrum disorder (level 2)  Surgical History Procedure Laterality Date  . CIRCUMCISION  1989   Family History family history includes Cancer in his maternal grandmother; Lung cancer in his paternal grandfather. Family history is negative for migraines, seizures, intellectual disabilities, blindness, deafness, birth defects, chromosomal disorder, or autism.  Social History Social History   Socioeconomic History  . Marital status:  Single  . Years of education:  1113  . Highest education level:  High school certificate  Occupational History  .  Unemployed  Social Needs  . Financial resource strain: Not on file  . Food insecurity:    Worry: Not on file    Inability: Not on file  . Transportation needs:    Medical: Not on file    Non-medical: Not on file  Tobacco Use  . Smoking status: Never Smoker  . Smokeless tobacco: Never Used  Substance and Sexual Activity  . Alcohol use: No  Alcohol/week: 0.0 standard drinks  . Drug use: No  . Sexual activity: Never  Social History Narrative    Roberto Ramirez is a 33 yo male.    He attends [employer] in [place].    He lives with his parents. He has two siblings.   No Known Allergies  Physical Exam BP 108/74   Pulse 72   Ht 5\' 7"  (1.702 m)   Wt 117 lb 6.4 oz (53.3 kg)   BMI 18.39 kg/m   General: alert, well developed,  well nourished, in no acute distress, brown hair, brown eyes, right handed Head: normocephalic, no dysmorphic features Ears, Nose and Throat: Otoscopic: tympanic membranes normal; pharynx: oropharynx is pink without exudates or tonsillar hypertrophy Neck: supple, full range of motion, no cranial or cervical bruits Respiratory: auscultation clear Cardiovascular: no murmurs, pulses are normal Musculoskeletal: no skeletal deformities or apparent scoliosis Skin: no rashes or neurocutaneous lesions  Neurologic Exam  Mental Status: alert; oriented to person; knowledge is below normal for age; language is limited; he is able to follow simple commands, he makes poor eye contact; he sat on the floor for history taking and physical examination once I got him up, he would not sit in a chair Cranial Nerves: visual fields are full to double simultaneous stimuli; extraocular movements are full and conjugate; pupils are round reactive to light; funduscopic examination shows sharp disc margins with normal vessels; symmetric facial strength; midline tongue and uvula; air conduction is greater than bone conduction bilaterally Motor: Normal strength, tone and mass; good fine motor movements; no pronator drift Sensory: intact responses to cold, vibration, proprioception and stereognosis Coordination: good finger-to-nose, rapid repetitive alternating movements and finger apposition Gait and Station: broad-based but stable gait and station: patient is able to walk on heels, toes and tandem without difficulty; balance is adequate; Romberg exam is negative; Gower response is negative Reflexes: symmetric and diminished bilaterally; no clonus; bilateral flexor plantar responses  Assessment 1. Autism spectrum disorder with accompanying intellectual impairment requiring substantial support (level 2), F84.0. 2. Focal epilepsy with impairment of consciousness, G40.209. 3. Abnormality of gait, R26.9. 4. Anxiety state,  F41.1.  Discussion I am pleased that the patient is neurologically medically stable.  I am also pleased that his seizures are under control.  More importantly, I think that some of the major problems that he had including manifesting his anxiety and experiencing enuresis have subsided greatly.  Plan I refilled his prescription for carbamazepine, clonazepam, and clonidine.  He will return to see me in a year unless there are new problems in which case I will see him sooner.  I asked his mother to contact me should that arise.  Greater than 50% of the 25 minute visit was spent in counseling and coordination of care concerning his autism, seizures, and anxiety.   Medication List   Accurate as of August 09, 2018 11:59 PM.    b complex vitamins capsule Take 1 capsule by mouth daily.   carbamazepine 300 MG 12 hr capsule Commonly known as:  CARBATROL Take 2 capsules twice daily   cetirizine 10 MG chewable tablet Commonly known as:  ZYRTEC Chew 10 mg by mouth daily.   clonazePAM 0.5 MG tablet Commonly known as:  KLONOPIN Take one half tablet by mouth 3 times daily and one tablet at bedtime   cloNIDine 0.1 MG tablet Commonly known as:  CATAPRES TAKE ONE HALF (1/2) TABLET BY MOUTH 3 TIMES DAILY AND 1 TABLET EVERY EVENING AT 9PM. (ROUND WHITE TAB  WITH U 135)   diphenhydrAMINE 25 MG tablet Commonly known as:  SOMINEX Take 25 mg by mouth at bedtime as needed for sleep.   escitalopram 20 MG tablet Commonly known as:  LEXAPRO Take 20 mg by mouth daily.   fluticasone 27.5 MCG/SPRAY nasal spray Commonly known as:  VERAMYST Place 2 sprays into the nose daily. Each nostril   multivitamin tablet Take 1 tablet by mouth daily.   polyethylene glycol packet Commonly known as:  MIRALAX / GLYCOLAX Take 17 g by mouth daily.   pseudoephedrine 120 MG 12 hr tablet Commonly known as:  SUDAFED Take 120 mg by mouth every 12 (twelve) hours as needed for congestion.    The medication list was  reviewed and reconciled. All changes or newly prescribed medications were explained.  A complete medication list was provided to the patient/caregiver.  Deetta Perla MD

## 2018-12-01 ENCOUNTER — Other Ambulatory Visit (INDEPENDENT_AMBULATORY_CARE_PROVIDER_SITE_OTHER): Payer: Self-pay | Admitting: Pediatrics

## 2018-12-01 DIAGNOSIS — G40209 Localization-related (focal) (partial) symptomatic epilepsy and epileptic syndromes with complex partial seizures, not intractable, without status epilepticus: Secondary | ICD-10-CM

## 2019-07-05 ENCOUNTER — Other Ambulatory Visit (INDEPENDENT_AMBULATORY_CARE_PROVIDER_SITE_OTHER): Payer: Self-pay | Admitting: Pediatrics

## 2019-07-05 DIAGNOSIS — G40209 Localization-related (focal) (partial) symptomatic epilepsy and epileptic syndromes with complex partial seizures, not intractable, without status epilepticus: Secondary | ICD-10-CM

## 2019-10-31 ENCOUNTER — Ambulatory Visit (INDEPENDENT_AMBULATORY_CARE_PROVIDER_SITE_OTHER): Payer: Medicaid Other | Admitting: Pediatrics

## 2019-10-31 ENCOUNTER — Other Ambulatory Visit: Payer: Self-pay

## 2019-10-31 ENCOUNTER — Encounter (INDEPENDENT_AMBULATORY_CARE_PROVIDER_SITE_OTHER): Payer: Self-pay | Admitting: Pediatrics

## 2019-10-31 VITALS — BP 110/90 | HR 84 | Ht 67.0 in | Wt 122.8 lb

## 2019-10-31 DIAGNOSIS — R269 Unspecified abnormalities of gait and mobility: Secondary | ICD-10-CM | POA: Diagnosis not present

## 2019-10-31 DIAGNOSIS — F411 Generalized anxiety disorder: Secondary | ICD-10-CM

## 2019-10-31 DIAGNOSIS — F840 Autistic disorder: Secondary | ICD-10-CM | POA: Diagnosis not present

## 2019-10-31 DIAGNOSIS — R159 Full incontinence of feces: Secondary | ICD-10-CM

## 2019-10-31 DIAGNOSIS — G40209 Localization-related (focal) (partial) symptomatic epilepsy and epileptic syndromes with complex partial seizures, not intractable, without status epilepticus: Secondary | ICD-10-CM

## 2019-10-31 DIAGNOSIS — R32 Unspecified urinary incontinence: Secondary | ICD-10-CM | POA: Diagnosis not present

## 2019-10-31 MED ORDER — CLONAZEPAM 1 MG PO TABS: 1.0000 mg | ORAL_TABLET | Freq: Two times a day (BID) | ORAL | 5 refills | Status: DC | PRN

## 2019-10-31 MED ORDER — CLONIDINE HCL 0.1 MG PO TABS: ORAL_TABLET | ORAL | 5 refills | Status: DC

## 2019-10-31 MED ORDER — CARBAMAZEPINE ER 300 MG PO CP12: ORAL_CAPSULE | ORAL | 5 refills | Status: DC

## 2019-10-31 NOTE — Progress Notes (Signed)
Patient: Roberto Ramirez MRN: [id number] Sex: male DOB: 21-May-1986  Provider: Wyline Copas, MD Location of Care: [facility]  Note type: Routine return visit  History of Present Illness: Referral Source: Heide Scales, NP History from: mother, patient and [facility] chart Chief Complaint: Seizures/Autism  Roberto Ramirez is a 34 y.o. male who was evaluated October 31, 2019 for the first time since August 09, 2018.  Gabriela has autism spectrum disorder, level 3.  On his last visit his seizures were in good control he had problems with unsteady gait.  He also showed behaviors where he would suddenly stop and not move.  It was thought this might represent anxiety.  He also was suffering from an acute viral syndrome.  I thought that he was medically stable and refilled his medications for carbamazepine clonazepam and clonidine.  He returns a little more than 14 months after his last visit and remains seizure-free however he is experiencing several behaviors that are concerning.  He will suddenly stop and sit down in the middle of the floor and not move.  It is very difficult to get him up.  I was able to do so successfully but there are times that his mother has great difficulty in doing so.  He will often crawl to get from one place to another in his home.  He will suddenly drop to the ground while his mother is holding onto him.  This has strained her shoulders.  His mother has purchased an inexpensive portable wheelchair to use when they have to go out shopping.  It is very difficult for him to get into the shower because he has to get into a tub.  It is hard to get him to lift his legs.  His mother has a shower chair that he can seat him once he is there.  He is not dressing himself.  He had gotten himself back to full continence and now rarely if ever uses the commode.  Sometimes he will sit on the commode, not eliminate, and then eliminate soon as he is put back in a diaper.  He  needs to be in the diaper throughout the day to keep himself from being soiled.  He wears a diaper at home and at his adult daycare.  His appetite seems to be less although he has gained over 5 pounds since his last visit.  He has significant problems with constipation and takes MiraLAX.  He was also on Linzess but stopped it because it seemed to upset his stomach.  He now has bowel movements about 2-3 times per week.  He is able to walk.  When he does so he is gait is slightly broad-based as it was previously.  I do not see any significant change in the gait in comparison with 14 months ago.  It appears that he seems to be choosing not to walk.  Fortunately the family lives in an apartment so he does not have to negotiate stairs.  He does better if someone is holding onto him or he is holding onto some object with his hands.  I do not understand the reason for the latter.  He contracted Covid in January 2021.  His mother also became ill with Covid.  I do not think this has anything to do with it.  He never got very sick with upper or lower respiratory symptoms, GI symptoms or obvious neurologic symptoms.  Except for that time, he is continue to attend his  adult daycare center so he has not suffered from social deprivation.  He goes to bed around 9:30 PM, falls asleep quickly and sleeps soundly.  He is adult daycare called [employer] between 8:30 AM and 2:30 PM.  Mother is his sole care taker although his sister lives close by and sometimes helps out.  He has limited vocabulary.    In general for all these areas are he has become dependent.  As the day goes on he gets worse.  His health is otherwise been good.  Review of Systems: A complete review of systems was remarkable for patient is here to be seen for seizues and autism. Mom reports that the patient has not had any seizures since his last visit. She states that the patient has now started to sit in the middle of the floor and/or crawl. She  also states that the patient used to be able to go to the bathroom but now he will use the bathroom on himself. She states that he has started to have to touch things. She is not sure if this is part of his anxiety or not. No other concerns at this time., all other systems reviewed and negative.  Past Medical History Diagnosis Date  . Seizures (HCC)    Hospitalizations: No., Head Injury: No., Nervous System Infections: No., Immunizations up to date: Yes.    Copied from prior chart ER visit during the summer of 2015 due to anxiety attack.   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 His last known seizure was in 2003. MRI scan of the brain showed slight decrease in myelinization for age, but no other specific findings of disorders of brain formation. I do not know where this took place. Medications in the past have included Depakene, Dilantin, and Diamox before carbamazepine.  Birth History 7 lbs. 9 oz. born at full-term to a 32 year old primigravida after an uncomplicated pregnancy.  Mother had dental films during gestation which was otherwise uncomplicated.  Labor lasted for 26 hours. He had a normal spontaneous vaginal delivery. At birth he had some cold stress but no other history of problems.  He was breast-fed for 8 months.  He had developmental delay in his gross and fine motor milestones but in particular language and socialization.  Behavior History Autism spectrum disorder (level 3)  Surgical History Procedure Laterality Date  . CIRCUMCISION  1989   Family History family history includes Cancer in his maternal grandmother; Lung cancer in his paternal grandfather. Family history is negative for migraines, seizures, intellectual disabilities, blindness, deafness, birth defects, chromosomal disorder, or autism.  Social History Socioeconomic  History  . Marital status:  Single  . Years of education:  87  . Highest education level:  High school certificate  Occupational History  . Not employed  Tobacco Use  . Smoking status: Never Smoker  . Smokeless tobacco: Never Used  Substance and Sexual Activity  . Alcohol use: No    Alcohol/week: 0.0 standard drinks  . Drug use: No  . Sexual activity: Never  Social History Narrative    Foday is a 34 yo male.    He attends [employer] in [place].    He lives with his parents. He has two siblings.   No Known Allergies  Physical Exam BP 110/90   Pulse 84   Ht 5\' 7"  (1.702 m)   Wt 122  lb 12.8 oz (55.7 kg)   BMI 19.23 kg/m   General: alert, well developed, well nourished, in no acute distress, brown/gray hair, brown eyes, right handed Head: normocephalic, no dysmorphic features Ears, Nose and Throat: Otoscopic: tympanic membranes normal; pharynx: oropharynx is pink without exudates or tonsillar hypertrophy Neck: supple, full range of motion, no cranial or cervical bruits Respiratory: auscultation clear Cardiovascular: no murmurs, pulses are normal Musculoskeletal: no skeletal deformities or apparent scoliosis Skin: no rashes or neurocutaneous lesions  Neurologic Exam  Mental Status: alert; oriented to person; knowledge is significantly below normal for age; language is limited; he was able to follow some simple commands, but for the most part even though I turned him around so that he could face me (he was sitting on the floor) he did not make eye contact Cranial Nerves: visual fields are full to double simultaneous stimuli; extraocular movements are full and conjugate; pupils are round reactive to light; funduscopic examination shows bilateral positive red reflex; symmetric, impassive facial strength; midline tongue; inconsistently turns to localize sound bilaterally Motor: normal functional strength, tone and mass; he is able to lift his limbs against gravity, his arms  above his shoulders, grasp objects with his fingers; I watched him to spin his spinner with his pointer finger.  He did not want to take objects that I gave to him; I could not test pronator drift Sensory: withdraws x4 Coordination: Unable to test due to lack of cooperation Gait and Station: broad-based, slightly shuffling gait and station; he is able to walk on his own but does better when his hands are held although he is bearing weight on his legs and moving forward with his feet straight ahead patient is able to walk on heels, toes and tandem without difficulty; balance is fair; Gower could not be assessed Reflexes: symmetric and normal at the knees diminished elsewhere bilaterally; no clonus; bilateral flexor plantar responses  Assessment 1.  Autism spectrum disorder with accompanying language impairment and intellectual disability requiring very substantial support, level 3, F84.0. 2.  Abnormality of gait, R26.9. 3.  Urinary incontinence, R32. 4.  Fecal incontinence, R15.9. 5.  Partial epilepsy with impairment of consciousness, G40.209. 6.  Anxiety state, F41.1.  Discussion I have seen apparent deterioration in activities of daily living and other young adults with autism.  I think that is behavioral and not biologic.  It is an attempt to exert control in their environment in the few ways that they have left to control.  Nonetheless it is worthwhile for [place] to evaluate his brain and make certain that there are no changes in the brain that would suggest an organic brain syndrome.  Plan He will need to have an MRI scan of the brain with and without contrast under general anesthesia.  I think is the only way that we will be successful and get a good study.  This has to be planned with anesthesiology and radiology.  I would not consider placing him on medication for these behaviors because I am not certain whether it would help and how he would respond.  He will return to see me in 6 months  time.  I will see him sooner based on clinical need.  Prescriptions were issued for clonazepam, clonidine, and carbamazepine.  Greater than 50% of a 40-minute visit was spent in counseling coordination of care concerning his apparent neurologic deterioration and discussing its work-up.  I asked for his primary physician to send any recent laboratories that have been performed.  Medication List   Accurate as of October 31, 2019 11:59 PM. If you have any questions, ask your nurse or doctor.    b complex vitamins capsule Take 1 capsule by mouth daily.   carbamazepine 300 MG 12 hr capsule Commonly known as: CARBATROL TAKE TWO (2) CAPSULES BY MOUTH TWICE A DAY. (PURPLE CAPSULE)   cetirizine 10 MG chewable tablet Commonly known as: ZYRTEC Chew 10 mg by mouth daily.   clonazePAM 1 MG tablet Commonly known as: KLONOPIN Take 1 tablet (1 mg total) by mouth 2 (two) times daily as needed for anxiety. What changed:   medication strength  how much to take  how to take this  when to take this  reasons to take this  additional instructions Changed by: Ellison Carwin, MD   cloNIDine 0.1 MG tablet Commonly known as: CATAPRES TAKE ONE HALF (1/2) TABLET BY MOUTH 3 TIMES DAILY AND 1 TABLET EVERY EVENING AT 9PM. (ROUND WHITE TAB WITH U 135)   diphenhydrAMINE 25 MG tablet Commonly known as: SOMINEX Take 25 mg by mouth at bedtime as needed for sleep.   escitalopram 20 MG tablet Commonly known as: LEXAPRO Take 20 mg by mouth daily.   fluticasone 27.5 MCG/SPRAY nasal spray Commonly known as: VERAMYST Place 2 sprays into the nose daily. Each nostril   multivitamin tablet Take 1 tablet by mouth daily.   polyethylene glycol 17 g packet Commonly known as: MIRALAX / GLYCOLAX Take 17 g by mouth daily.   pseudoephedrine 120 MG 12 hr tablet Commonly known as: SUDAFED Take 120 mg by mouth every 12 (twelve) hours as needed for congestion.    The medication list was reviewed and reconciled.  All changes or newly prescribed medications were explained.  A complete medication list was provided to the patient/caregiver.  Deetta Perla MD

## 2019-10-31 NOTE — Patient Instructions (Signed)
I am not certain why Roberto Ramirez has deteriorated but we will perform an MRI scan with anesthesia to make certain that there is no treatable cause.  I would like you to have the laboratories have been done in the past year sent to my office for my review.  We may add to that.  I have sent prescriptions for carbamazepine clonazepam and clonidine.  I would like to see him in 6 months.

## 2019-11-18 ENCOUNTER — Encounter (HOSPITAL_COMMUNITY): Payer: Self-pay | Admitting: Pediatrics

## 2019-11-19 ENCOUNTER — Other Ambulatory Visit (HOSPITAL_COMMUNITY)
Admission: RE | Admit: 2019-11-19 | Discharge: 2019-11-19 | Disposition: A | Discharge status: Home / Self Care | Payer: Medicaid Other | Source: Ambulatory Visit | Attending: Pediatrics | Admitting: Pediatrics

## 2019-11-19 DIAGNOSIS — Z01812 Encounter for preprocedural laboratory examination: Secondary | ICD-10-CM | POA: Insufficient documentation

## 2019-11-19 DIAGNOSIS — Z20822 Contact with and (suspected) exposure to covid-19: Secondary | ICD-10-CM | POA: Diagnosis not present

## 2019-11-19 LAB — SARS CORONAVIRUS 2 (TAT 6-24 HRS): SARS Coronavirus 2: NEGATIVE

## 2019-11-21 ENCOUNTER — Encounter (HOSPITAL_COMMUNITY): Payer: Self-pay | Admitting: Pediatrics

## 2019-11-21 ENCOUNTER — Other Ambulatory Visit: Payer: Self-pay

## 2019-11-21 NOTE — Progress Notes (Signed)
Spoke with Caregiver Amy Maple Hudson [phone] (Patient lives with Amy).  Amy states patient does not have SOB, fever, cough or chest pain.  PCP - Gennie Alma, NP Cardiologist - n/a Ped Neurologist - Dr Ellison Carwin  Chest x-ray - n/a EKG - n/a Stress Test - n/a ECHO - n/a Cardiac Cath - n/a  Anesthesia review: Yes I spoke with Dr Maple Hudson about patient's medical hx and needs for DOS.  Spoke with Dr Aleene Davidson about patient not being able to swallow his carbatrol capsule (usually takes med with pudding/applesauce).  Per Dr Aleene Davidson, ok for patient to skip morning DOS dose.  STOP now taking any Aspirin (unless otherwise instructed by your surgeon), Aleve, Naproxen, Ibuprofen, Motrin, Advil, Goody's, BC's, all herbal medications, fish oil, and all vitamins.   Coronavirus Screening Covid test on 11/19/19 was negative.  Mother Marcelino Duster verbalized understanding of instructions that were given via phone.

## 2019-11-21 NOTE — Progress Notes (Signed)
Mother Wadsworth Skolnick [phone] is patient's legal guardian.  She will be expecting a call from [facility] RN tomorrow between 8-10 am for telephone consent and anesthesia questions.

## 2019-11-21 NOTE — Anesthesia Preprocedure Evaluation (Addendum)
Anesthesia Evaluation  Patient identified by MRN, date of birth, ID band Patient awake    Reviewed: Allergy & Precautions, NPO status , Patient's Chart, lab work & pertinent test results  Airway Mallampati: I  TM Distance: >3 FB Neck ROM: Full    Dental   Pulmonary    Pulmonary exam normal        Cardiovascular Normal cardiovascular exam     Neuro/Psych Seizures -,  Anxiety Depression Autism   GI/Hepatic   Endo/Other    Renal/GU      Musculoskeletal   Abdominal   Peds  Hematology   Anesthesia Other Findings   Reproductive/Obstetrics                            Anesthesia Physical Anesthesia Plan  ASA: III  Anesthesia Plan: General   Post-op Pain Management:    Induction: Intravenous  PONV Risk Score and Plan: 2 and Treatment may vary due to age or medical condition  Airway Management Planned: LMA  Additional Equipment:   Intra-op Plan:   Post-operative Plan: Extubation in OR  Informed Consent: I have reviewed the patients History and Physical, chart, labs and discussed the procedure including the risks, benefits and alternatives for the proposed anesthesia with the patient or authorized representative who has indicated his/her understanding and acceptance.       Plan Discussed with: CRNA and Surgeon  Anesthesia Plan Comments: (PAT note written 11/21/2019 by Shonna Chock, PA-C. Has autism spectrum disorder, level 3. )       Anesthesia Quick Evaluation

## 2019-11-21 NOTE — Progress Notes (Signed)
Anesthesia Chart Review: Roberto Ramirez   Case: [id number] Date/Time: 11/22/19 0945   Procedure: MRI WITH ANESTHESIA BRAIN WITH AND WIHTOUT (N/A )   Anesthesia type: General   Pre-op diagnosis: AUTISM, EPILEPSY,ABNORMALITY GAIT   Location: [facility] RAD[facility]OLOGY RO[facility] / [facility] OR   Surgeons: Radiologist, Medication, MD    Special Needs: "AUTISTIC-NEED MORE THAN ONE PERSON-WILL FIGHT"   DISCUSSION: Patient is a 34 year old male scheduled for MRI of the brain under anesthesia of 11/22/19.  MRI was ordered by his neurologist Dr. Sharene Skeans following 10/31/19 evaluation (H&P) where mother discussed a change in behavior/deterioration in ADLs (suddenly sit in the floor and not move, soiling adult diaper when previously using toilet). Changes thought to likely be behavioral, but brain MRI ordered to evaluate for possible other causes.   History includes never smoker, autism spectrum disorder (level 3), seizure disorder (last 2003), anxiety, constipation, abnormal gait. COVID-19+ ~07/2019.   11/19/19 pre-procedure COVID-19 test negative. Anesthesia team to evaluate on the day of surgery.   VS: Ht 5\' 7"  (1.702 m)   Wt 55.7 kg   BMI 19.23 kg/m  As of 10/31/19, BP 110/90, HR 84   PROVIDERS: [place], 12/31/19, NP his PCP Orie Rout, MD is neurologist   LABS: Pre-procedure labs orders, if any, to be determined by anesthesiologist.   EKG: N/A   CV: N/A  Past Medical History:  Diagnosis Date  . Abnormal gait   . Anxiety   . Autism spectrum disorder requiring very substantial support (level 3)   . Constipation   . Seasonal allergies   . Seizures (HCC)     Past Surgical History:  Procedure Laterality Date  . CIRCUMCISION  1989    MEDICATIONS: No current facility-administered medications for this encounter.   Ellison Carwin b complex vitamins capsule  . carbamazepine (CARBATROL) 300 MG 12 hr capsule  . cetirizine (ZYRTEC) 10 MG tablet  . clonazePAM (KLONOPIN) 1 MG tablet  . cloNIDine (CATAPRES) 0.1 MG  tablet  . escitalopram (LEXAPRO) 20 MG tablet  . fluticasone (FLONASE) 50 MCG/ACT nasal spray  . Multiple Vitamin (MULTIVITAMIN) tablet  . naproxen sodium (ALEVE) 220 MG tablet  . polyethylene glycol (MIRALAX / GLYCOLAX) packet  . pseudoephedrine (SUDAFED) 120 MG 12 hr tablet    Marland Kitchen, PA-C Surgical Short Stay/Anesthesiology [facility] Phone [phone] [facility] Phone [phone] 11/21/2019 1:08 PM

## 2019-11-22 ENCOUNTER — Encounter (HOSPITAL_COMMUNITY)
Admission: RE | Disposition: A | Discharge status: Home / Self Care | Payer: Self-pay | Source: Home / Self Care | Attending: Pediatrics

## 2019-11-22 ENCOUNTER — Ambulatory Visit (HOSPITAL_COMMUNITY): Payer: Medicaid Other | Admitting: Vascular Surgery

## 2019-11-22 ENCOUNTER — Ambulatory Visit (HOSPITAL_COMMUNITY)
Admission: RE | Admit: 2019-11-22 | Discharge: 2019-11-22 | Disposition: A | Discharge status: Home / Self Care | Payer: Medicaid Other | Attending: Pediatrics | Admitting: Pediatrics

## 2019-11-22 ENCOUNTER — Other Ambulatory Visit: Payer: Self-pay

## 2019-11-22 ENCOUNTER — Ambulatory Visit (HOSPITAL_COMMUNITY)
Admission: RE | Admit: 2019-11-22 | Discharge: 2019-11-22 | Disposition: A | Discharge status: Home / Self Care | Payer: Medicaid Other | Source: Ambulatory Visit | Attending: Pediatrics | Admitting: Pediatrics

## 2019-11-22 ENCOUNTER — Encounter (HOSPITAL_COMMUNITY): Payer: Self-pay | Admitting: Pediatrics

## 2019-11-22 DIAGNOSIS — F419 Anxiety disorder, unspecified: Secondary | ICD-10-CM | POA: Insufficient documentation

## 2019-11-22 DIAGNOSIS — F840 Autistic disorder: Secondary | ICD-10-CM

## 2019-11-22 DIAGNOSIS — R159 Full incontinence of feces: Secondary | ICD-10-CM

## 2019-11-22 DIAGNOSIS — R269 Unspecified abnormalities of gait and mobility: Secondary | ICD-10-CM | POA: Insufficient documentation

## 2019-11-22 DIAGNOSIS — G40109 Localization-related (focal) (partial) symptomatic epilepsy and epileptic syndromes with simple partial seizures, not intractable, without status epilepticus: Secondary | ICD-10-CM | POA: Insufficient documentation

## 2019-11-22 DIAGNOSIS — G40209 Localization-related (focal) (partial) symptomatic epilepsy and epileptic syndromes with complex partial seizures, not intractable, without status epilepticus: Secondary | ICD-10-CM

## 2019-11-22 DIAGNOSIS — Z8616 Personal history of COVID-19: Secondary | ICD-10-CM | POA: Insufficient documentation

## 2019-11-22 DIAGNOSIS — Z79899 Other long term (current) drug therapy: Secondary | ICD-10-CM | POA: Insufficient documentation

## 2019-11-22 DIAGNOSIS — R32 Unspecified urinary incontinence: Secondary | ICD-10-CM | POA: Insufficient documentation

## 2019-11-22 HISTORY — DX: Dependence on wheelchair: Z99.3

## 2019-11-22 HISTORY — DX: Unspecified abnormalities of gait and mobility: R26.9

## 2019-11-22 HISTORY — DX: Anxiety disorder, unspecified: F41.9

## 2019-11-22 HISTORY — PX: RADIOLOGY WITH ANESTHESIA: SHX6223

## 2019-11-22 HISTORY — DX: Depression, unspecified: F32.A

## 2019-11-22 HISTORY — DX: Autistic disorder: F84.0

## 2019-11-22 HISTORY — DX: Other developmental disorders of scholastic skills: F81.89

## 2019-11-22 HISTORY — DX: Other seasonal allergic rhinitis: J30.2

## 2019-11-22 HISTORY — DX: Constipation, unspecified: K59.00

## 2019-11-22 SURGERY — MRI WITH ANESTHESIA
Anesthesia: General

## 2019-11-22 MED ORDER — MIDAZOLAM HCL 2 MG/2ML IJ SOLN
INTRAMUSCULAR | Status: AC
  Filled 2019-11-22: qty 2

## 2019-11-22 MED ORDER — ONDANSETRON HCL 4 MG/2ML IJ SOLN: 4.0000 mg | Freq: Once | INTRAMUSCULAR | Status: DC | PRN

## 2019-11-22 MED ORDER — ONDANSETRON HCL 4 MG/2ML IJ SOLN
INTRAMUSCULAR | Status: DC | PRN
  Administered 2019-11-22: 4 mg via INTRAVENOUS

## 2019-11-22 MED ORDER — HYDROMORPHONE HCL 1 MG/ML IJ SOLN: 0.2500 mg | INTRAMUSCULAR | Status: DC | PRN

## 2019-11-22 MED ORDER — DEXAMETHASONE SODIUM PHOSPHATE 10 MG/ML IJ SOLN
INTRAMUSCULAR | Status: DC | PRN
  Administered 2019-11-22: 5 mg via INTRAVENOUS

## 2019-11-22 MED ORDER — MIDAZOLAM HCL 5 MG/5ML IJ SOLN
INTRAMUSCULAR | Status: DC | PRN
  Administered 2019-11-22: 2 mg via INTRAVENOUS

## 2019-11-22 MED ORDER — PROPOFOL 10 MG/ML IV BOLUS
INTRAVENOUS | Status: DC | PRN
  Administered 2019-11-22: 150 mg via INTRAVENOUS

## 2019-11-22 MED ORDER — GADOBUTROL 1 MMOL/ML IV SOLN
5.0000 mL | Freq: Once | INTRAVENOUS | Status: AC | PRN
  Administered 2019-11-22: 5 mL via INTRAVENOUS

## 2019-11-22 MED ORDER — LIDOCAINE 2% (20 MG/ML) 5 ML SYRINGE
INTRAMUSCULAR | Status: DC | PRN
  Administered 2019-11-22: 100 mg via INTRAVENOUS

## 2019-11-22 MED ORDER — FENTANYL CITRATE (PF) 100 MCG/2ML IJ SOLN
INTRAMUSCULAR | Status: DC | PRN
  Administered 2019-11-22: 50 ug via INTRAVENOUS

## 2019-11-22 MED ORDER — EPHEDRINE SULFATE 50 MG/ML IJ SOLN
INTRAMUSCULAR | Status: DC | PRN
  Administered 2019-11-22: 10 mg via INTRAVENOUS

## 2019-11-22 MED ORDER — LACTATED RINGERS IV SOLN: INTRAVENOUS | Status: DC

## 2019-11-22 MED ORDER — DEXMEDETOMIDINE HCL IN NACL 200 MCG/50ML IV SOLN
INTRAVENOUS | Status: DC | PRN
  Administered 2019-11-22: 8 ug via INTRAVENOUS
  Administered 2019-11-22: 4 ug via INTRAVENOUS

## 2019-11-22 MED ORDER — MEPERIDINE HCL 25 MG/ML IJ SOLN: 6.2500 mg | INTRAMUSCULAR | Status: DC | PRN

## 2019-11-22 NOTE — Progress Notes (Signed)
No labs needed per Dr. Michelle Piper.

## 2019-11-22 NOTE — Anesthesia Procedure Notes (Signed)
Procedure Name: LMA Insertion Date/Time: 11/22/2019 10:28 AM Performed by: Elliot Dally, CRNA Pre-anesthesia Checklist: Patient identified, Emergency Drugs available, Suction available and Patient being monitored Patient Re-evaluated:Patient Re-evaluated prior to induction Oxygen Delivery Method: Circle System Utilized Preoxygenation: Pre-oxygenation with 100% oxygen Induction Type: IV induction Ventilation: Mask ventilation without difficulty LMA: LMA inserted LMA Size: 4.0 Number of attempts: 1 Airway Equipment and Method: Bite block Placement Confirmation: positive ETCO2 Tube secured with: Tape Dental Injury: Teeth and Oropharynx as per pre-operative assessment

## 2019-11-22 NOTE — Transfer of Care (Signed)
Immediate Anesthesia Transfer of Care Note  Patient: ROMEN YUTZY  Procedure(s) Performed: MRI WITH ANESTHESIA BRAIN WITH AND WIHTOUT (N/A )  Patient Location: PACU  Anesthesia Type:General  Level of Consciousness: drowsy  Airway & Oxygen Therapy: Patient Spontanous Breathing and Patient connected to nasal cannula oxygen  Post-op Assessment: Report given to RN and Post -op Vital signs reviewed and stable  Post vital signs: Reviewed and stable  Last Vitals:  Vitals Value Taken Time  BP 121/70 11/22/19 1138  Temp    Pulse 83 11/22/19 1141  Resp 15 11/22/19 1142  SpO2 100 % 11/22/19 1141  Vitals shown include unvalidated device data.  Last Pain:  Vitals:   11/22/19 0815  TempSrc: Oral         Complications: No apparent anesthesia complications

## 2019-11-22 NOTE — Anesthesia Postprocedure Evaluation (Signed)
Anesthesia Post Note  Patient: Roberto Ramirez  Procedure(s) Performed: MRI WITH ANESTHESIA BRAIN WITH AND WIHTOUT (N/A )     Patient location during evaluation: PACU Anesthesia Type: General Level of consciousness: awake and alert Pain management: pain level controlled Vital Signs Assessment: post-procedure vital signs reviewed and stable Respiratory status: spontaneous breathing, nonlabored ventilation, respiratory function stable and patient connected to nasal cannula oxygen Cardiovascular status: blood pressure returned to baseline and stable Postop Assessment: no apparent nausea or vomiting Anesthetic complications: no    Last Vitals:  Vitals:   11/22/19 1142 11/22/19 1143  BP:    Pulse:    Resp: 15 15  Temp:  (!) 36.1 C  SpO2:      Last Pain:  Vitals:   11/22/19 0815  TempSrc: Oral                 Maury Bamba DAVID

## 2019-11-22 NOTE — Progress Notes (Signed)
MRI form sent to Radiology via tube station. Paper work filled out and signed by caregiver Ardeth Perfect. Per Richardo Hanks (mom/legal guardian) that was fine.

## 2019-11-25 ENCOUNTER — Telehealth (INDEPENDENT_AMBULATORY_CARE_PROVIDER_SITE_OTHER): Payer: Self-pay | Admitting: Pediatrics

## 2019-11-25 NOTE — Telephone Encounter (Signed)
I left a message for mother to call. 

## 2019-11-28 ENCOUNTER — Telehealth (INDEPENDENT_AMBULATORY_CARE_PROVIDER_SITE_OTHER): Payer: Self-pay | Admitting: Pediatrics

## 2019-11-28 NOTE — Telephone Encounter (Signed)
  Who's calling (name and relationship to patient) :Amy Young / Care taker   Best contact [phone]  Provider they see:Dr. Sharene Skeans  Reason for call:requesting that blood work be done and also has some medical questions for Dr. Sharene Skeans. Please advise      PRESCRIPTION REFILL ONLY  Name of prescription:  Pharmacy:

## 2019-11-28 NOTE — Telephone Encounter (Signed)
L/M requesting a call back to discuss mom's phone note further.

## 2019-11-29 NOTE — Telephone Encounter (Signed)
I left a message for the caregiver to call back.

## 2019-11-30 ENCOUNTER — Other Ambulatory Visit: Payer: Self-pay

## 2019-11-30 ENCOUNTER — Ambulatory Visit (INDEPENDENT_AMBULATORY_CARE_PROVIDER_SITE_OTHER): Payer: Medicaid Other | Admitting: Pediatrics

## 2019-11-30 ENCOUNTER — Encounter (INDEPENDENT_AMBULATORY_CARE_PROVIDER_SITE_OTHER): Payer: Self-pay | Admitting: Pediatrics

## 2019-11-30 VITALS — Ht 67.0 in | Wt 118.0 lb

## 2019-11-30 DIAGNOSIS — R630 Anorexia: Secondary | ICD-10-CM | POA: Diagnosis not present

## 2019-11-30 DIAGNOSIS — R159 Full incontinence of feces: Secondary | ICD-10-CM

## 2019-11-30 DIAGNOSIS — R32 Unspecified urinary incontinence: Secondary | ICD-10-CM

## 2019-11-30 DIAGNOSIS — G40209 Localization-related (focal) (partial) symptomatic epilepsy and epileptic syndromes with complex partial seizures, not intractable, without status epilepticus: Secondary | ICD-10-CM

## 2019-11-30 DIAGNOSIS — R269 Unspecified abnormalities of gait and mobility: Secondary | ICD-10-CM

## 2019-11-30 DIAGNOSIS — F840 Autistic disorder: Secondary | ICD-10-CM

## 2019-11-30 NOTE — Telephone Encounter (Signed)
Patient is here today for evaluation.  We will try to work out a better way to communicate.

## 2019-11-30 NOTE — Patient Instructions (Addendum)
Thank you for coming today. I think that this is a behavioral issue.  I don't know how to fix it.

## 2019-11-30 NOTE — Progress Notes (Deleted)
Patient: Roberto Ramirez MRN: [id number] Sex: male DOB: [birth year]  Provider: Wyline Copas, MD Location of Care: [facility]  Note type: Routine return visit  History of Present Illness: Referral Source: Roberto Scales, NP History from: mother and aide, patient and [facility] chart Chief Complaint: Seizures/Autism  Roberto Ramirez is a 34 y.o. male who ***  Review of Systems: A complete review of systems was remarkable for patient is here to be seen for seizures and autism.No other concenrs at this time., all other systems reviewed and negative.  Past Medical History Past Medical History:  Diagnosis Date  . Abnormal gait   . Anxiety   . Autism spectrum disorder requiring very substantial support (level 3)       . Constipation   . Depression   . Non-verbal learning disorder   . Seasonal allergies   . Seizures ([place])    last seizure yrs ago in high school, none as adult controlled with meds  . Uses wheelchair    as needed when pt goes shoppjng. medical appts, trips,  Patient can walk but abn gait.   Hospitalizations: No., Head Injury: No., Nervous System Infections: No., Immunizations up to date: Yes.    ***  Birth History *** lbs. *** oz. infant born at *** weeks gestational age to a *** year old g *** p *** *** *** *** male. Gestation was {Complicated/Uncomplicated ZHYQMVHQI:69629} Mother received {CN Delivery analgesics:210120005}  {method of delivery:313099} Nursery Course was {Complicated/Uncomplicated:20316} Growth and Development was {cn recall:210120004}  Behavior History {Symptoms; behavioral problems:18883}  Surgical History Past Surgical History:  Procedure Laterality Date  . CIRCUMCISION  1989  . RADIOLOGY WITH ANESTHESIA N/A 11/22/2019   Procedure: MRI WITH ANESTHESIA BRAIN WITH AND WIHTOUT;  Surgeon: Radiologist, Medication, MD;  Location: [facility];  Service: Radiology;  Laterality: N/A;    Family History family history includes Cancer  in his maternal grandmother; Lung cancer in his paternal grandfather. Family history is negative for migraines, seizures, intellectual disabilities, blindness, deafness, birth defects, chromosomal disorder, or autism.  Social History Social History   Socioeconomic History  . Marital status: Unknown    Spouse name: Not on file  . Number of children: Not on file  . Years of education: Not on file  . Highest education level: Not on file  Occupational History  . Not on file  Tobacco Use  . Smoking status: Never Smoker  . Smokeless tobacco: Never Used  Substance and Sexual Activity  . Alcohol use: No    Alcohol/week: 0.0 standard drinks  . Drug use: No  . Sexual activity: Never  Other Topics Concern  . Not on file  Social History Narrative   Roberto Ramirez is a 34 yo male.   He attends [employer] in [place].   He lives with his parents. He has two siblings.   Social Determinants of Health   Financial Resource Strain:   . Difficulty of Paying Living Expenses:   Food Insecurity:   . Worried About Charity fundraiser in the Last Year:   . Arboriculturist in the Last Year:   Transportation Needs:   . Film/video editor (Medical):   Marland Kitchen Lack of Transportation (Non-Medical):   Physical Activity:   . Days of Exercise per Week:   . Minutes of Exercise per Session:   Stress:   . Feeling of Stress :   Social Connections:   . Frequency of Communication with Friends and Family:   .  Frequency of Social Gatherings with Friends and Family:   . Attends Religious Services:   . Active Member of Clubs or Organizations:   . Attends Banker Meetings:   Marland Kitchen Marital Status:      Allergies No Known Allergies  Physical Exam Ht 5\' 7"  (1.702 m)   Wt 118 lb (53.5 kg)   BMI 18.48 kg/m   ***   Assessment   Discussion   Plan  Allergies as of 11/30/2019   No Known Allergies     Medication List       Accurate as of Nov 30, 2019  9:24 AM. If you have any questions,  ask your nurse or doctor.        b complex vitamins capsule Take 1 capsule by mouth daily.   carbamazepine 300 MG 12 hr capsule Commonly known as: CARBATROL TAKE TWO (2) CAPSULES BY MOUTH TWICE A DAY. (PURPLE CAPSULE) What changed:   how much to take  how to take this  when to take this  additional instructions   cetirizine 10 MG tablet Commonly known as: ZYRTEC Take 10 mg by mouth daily.   clonazePAM 1 MG tablet Commonly known as: KLONOPIN Take 1 tablet (1 mg total) by mouth 2 (two) times daily as needed for anxiety. What changed: when to take this   cloNIDine 0.1 MG tablet Commonly known as: CATAPRES TAKE ONE HALF (1/2) TABLET BY MOUTH 3 TIMES DAILY AND 1 TABLET EVERY EVENING AT 9PM. (ROUND WHITE TAB WITH U 135) What changed:   how much to take  how to take this  when to take this  additional instructions   escitalopram 20 MG tablet Commonly known as: LEXAPRO Take 20 mg by mouth daily.   fluticasone 50 MCG/ACT nasal spray Commonly known as: FLONASE Place 2 sprays into both nostrils daily.   LINZESS PO Take by mouth daily.   multivitamin tablet Take 1 tablet by mouth daily.   naproxen sodium 220 MG tablet Commonly known as: ALEVE Take 220 mg by mouth daily as needed (pain).   nitrofurantoin (macrocrystal-monohydrate) 100 MG capsule Commonly known as: MACROBID Take 100 mg by mouth 2 (two) times daily.   polyethylene glycol 17 g packet Commonly known as: MIRALAX / GLYCOLAX Take 17 g by mouth daily at 6 PM.   pseudoephedrine 120 MG 12 hr tablet Commonly known as: SUDAFED Take 120 mg by mouth every 12 (twelve) hours as needed for congestion.       The medication list was reviewed and reconciled. All changes or newly prescribed medications were explained.  A complete medication list was provided to the patient/caregiver.  Dec 02, 2019 MD

## 2019-11-30 NOTE — Progress Notes (Signed)
Patient: Roberto Ramirez MRN: [id number] Sex: male DOB: 09/13/1985  Provider: Wyline Copas, MD Location of Care: [facility]  Note type: Routine return visit  History of Present Illness: Referral Source: Heide Scales, NP History from: mother and caregiver Chief Complaint: follow up of unsteady gait, seizures, autism   Roberto Ramirez is a 34 y.o. male who was last seen on 10/31/19. He returns today for follow up after MRI performed on 11/22/19. He has a past medical history of autism spectrum disorder, level 3 and seizure disorder. Mother and caregiver are present today to help provide history. During his last visit family was concerned about worsening gait instability. Family would report odd behaviors of where he would suddenly stop and not move. He would ambulate a short distance and then sit down. He would often crawl around to get from one place to another. Caregiver has had to purchase a portable wheelchair to get him around to go shopping, which is new as he once would walk with her to shop without difficulty.   He had an MRI performed on 10/24/19 that was unremarkable.  This was reviewed and I agree with the findings.  Since his last visit he has been having staring spells but family is able to quickly regain his attention. Denies any abnormal body movements or loss of consciousness.   Family notes that he used to be able to feed himself but now requires full assistance. Prior to the COVID infection he would "scrape" foods with a fork and grab foods with his hands. Now he wont feed himself. He has even lost desire to eat even if someone attempts to feed him. He has lost 4lbs since 11/22/19.   Family notes he was fully potty trained, but is now completely incontinent requiring full-time pull ups. They will attempt to place him on the toilet but often fights his caregiver to remain sitting. He will not go to the restroom anymore by himself.  Patient returns today with  similar symptoms. Per caregiver, the symptoms have not worsened.  Caregiver notes he will randomly fall to the ground, he will attempt to get up by holding the counter. He will get about half way up but will seem to have difficulty until someone comes to him to help him up. This behavior is new for patient over the last few months. He seems to prefer the ground as "it is his safe place because he knows he wont fall". He has not had any falls however. Family notes that he can walk independently and will walk through a room but will return crawling. He does sit down on the couch without difficulty and stand up without difficulty. However, he will walk short distances but his legs begin to buckle and he then sits completely onto the ground. While sitting in a chair, he exhibits behaviors of unsteadiness as if he is going to fall out of the chair. He often requires someone's assistance to get up and will hold on very tightly as if he will fall without them. .  Family also notices a new resting tremor since his COVID infection in February.   Caregiver notes that she placed a compression vest on to help with his anxiety yesterday. She notes that he responded very well to this. He sat on the couch at the adult day care and watched Spongebob and had no difficulty. He was able to sit in the chair, stand up, and walk around without difficulty. He did not  crawl at all. He even pulled out a chair to sit down, which he has not done that in a long time.   In general his health has been good.  There has been no issue with his sleep there are times that he refuses to eat or drink for extended periods there are times also that his mother is noted that he has some difficulty and seems to take effort to swallow.  This is not all the time  Review of Systems:  Mom reports that the patient has worsened since his last visit. She reports that he is stopping in the middle of walking and sitting down on the floor. She also states  that he cannot do his daily activities. She states that he grabs tightly when he thinks he is going to fall. she has other concerns written down for Dr. Gaynell Face. A complete review of systems was assessed and is otherwise negative.  Past Medical History Diagnosis Date  . Abnormal gait   . Anxiety   . Autism spectrum disorder requiring very substantial support (level 3)       . Constipation   . Depression   . Non-verbal learning disorder   . Seasonal allergies   . Seizures ([place])    last seizure yrs ago in high school, none as adult controlled with meds  . Uses wheelchair    as needed when pt goes shoppjng. medical appts, trips,  Patient can walk but abn gait.   Hospitalizations: No., Head Injury: No., Nervous System Infections: No., Immunizations up to date: Yes.    Copied from prior chart ER visit during the summer of 2015 due to anxiety attack.   Evaluation in the past includes routine karyotype 60 XY, negative fragile X syndrome by molecular PCR, normal TSH, and normal CT scan of the brain July 21, 2000. His last known seizure was in 2003. MRI scan of the brain showed slight decrease in myelinization for age, but no other specific findings of disorders of brain formation. I do not know where this took place. Medications in the past have included Depakene, Dilantin, and Diamox before carbamazepine.  Birth History 7 lbs. 9 oz. born at full-term to a 32 year old primigravida after an uncomplicated pregnancy.  Mother had dental films during gestation which was otherwise uncomplicated.  Labor lasted for 26 hours. He had a normal spontaneous vaginal delivery. At birth he had some cold stress but no other history of problems.  He was breast-fed for 8 months.  He had developmental delay in his gross and fine motor milestones but in particular language and socialization.  Behavior History Autism spectrum disorder (level 3)  Surgical History Procedure Laterality Date  .  CIRCUMCISION  1989  . RADIOLOGY WITH ANESTHESIA N/A 11/22/2019   Procedure: MRI WITH ANESTHESIA BRAIN WITH AND WIHTOUT;  Surgeon: Radiologist, Medication, MD;  Location: [facility];  Service: Radiology;  Laterality: N/A;   Family History family history includes Cancer in his maternal grandmother; Lung cancer in his paternal grandfather. Family history is negative for migraines, seizures, intellectual disabilities, blindness, deafness, birth defects, chromosomal disorder, or autism.  Social History Socioeconomic History  . Marital status: Unknown  . Years of education:  76  . Highest education level:  High school certificate  Occupational History  . Not employed  Tobacco Use  . Smoking status: Never Smoker  . Smokeless tobacco: Never Used  Substance and Sexual Activity  . Alcohol use: No    Alcohol/week: 0.0 standard drinks  . Drug  use: No  . Sexual activity: Never  Social History Narrative    Drevin is a 34 yo male.    He attends [employer] in [place].    He lives with his parents. He has two siblings.   No Known Allergies  Physical Exam Ht _0  (1.702 m)   Wt 118 lb (53.5 kg)   BMI 18.48 kg/m  General: alert, well developed, well nourished, in no acute distress, Brown/grey hair, sitting on floor through most of exam Head: normocephalic, no dysmorphic features Neck: supple, full range of motion, no cranial or cervical bruits Respiratory: auscultation clear Cardiovascular: no murmurs, pulses are normal Musculoskeletal: no skeletal deformities or apparent scoliosis Skin: no rashes or neurocutaneous lesions  Neurologic Exam  Mental Status: alert; oriented to person; knowledge is significantly below normal for age; language is limited - repeated his name throughout exam, can follow simple commands, difficult to obtain or maintain eye contact Cranial Nerves: extraocular movements are full and conjugate; pupils are round reactive to light; symmetric facial strength; midline  tongue; impassive facial strength, inconsistently turns to localize sound bilaterally Motor: Normal functional strength, tone and mass; he is able to lift limbs against gravity and ambulate with minimal assistance, lift arms above shoulders, grasps things with his hands bilaterally, unable to test for pronator drift, he would attempt to grab items with hands bilaterally although he did seem to favor left hand; I had to hold onto the left hand in order to get him to grab an object with the right Sensory: withdraws x 4 Coordination: unable to test due to lack of coordination and inability to follow more complex commands Gait and Station: able to rise from sitting position with minimal assistance, broad based diplegic gait and station, able to walk on his own but does better when his hands are being held, he does seem to develop unsteadiness at end of the walk (toes and knees become more valgus, increase in knee bending), in which he then goes to sitting position,  Unable to assess his ability to walk on heels, toes and tandem without difficulty; balance is fair Romberg or Gower response could not be assessed Reflexes: symmetric and diminished bilaterally; no clonus; bilateral flexor plantar responses  Assessment 1. Autism spectrum disorder with accompanying language impairment and intellectual disability requiring very substantial support, level 3, F84.0. 2.  Abnormality of gait, R26.9. 3. Urinary incontinence, R32. 4. Fecal incontinence, R15.9. 5. Partial epilepsy with impairment of consciousness, G40.209. 6.  Anxiety state, F41.1.  Discussion Viyaan returns today for follow-up gait abnormality and discussion of MRI results .  MRI obtained and negative for underlying cause of deterioration. It is becoming more apparent that he is having natural deterioration in activities of daily living in the setting of his known autism disorder.  Suspect this is more behavioral rather than biologic as his  symptoms do seem to improve when his anxiety is better controlled with compression vest.  I do not believe medications are contributing or any changes are warranted at this time.  Given his reported anorexia and weight loss, will obtain blood work to further evaluate including CBC with diff, CMP, CK, ESR, Folate, B12, TSH, T3/T4. We will also consult our nutritionist for further assistance.  Plan Long discussion with family in regards to likely natural but unusual progression of his autism spectrum disorder.  Recommended they continue the compression vest given improvement in symptoms.  We will continue current medications as prescribed. As stated above, will obtain blood work  to rule out any other underlying cause for symptoms as well as evaluate for any potential derangements given reported anorexia and weight loss. Greater than 50% of visit was spent in counseling coordination of care concerning its apparent neurological deterioration. Nutritionist consulted for further assistance.    Medication List   Accurate as of Nov 30, 2019  1:09 PM. If you have any questions, ask your nurse or doctor.    b complex vitamins capsule Take 1 capsule by mouth daily.   carbamazepine 300 MG 12 hr capsule Commonly known as: CARBATROL TAKE TWO (2) CAPSULES BY MOUTH TWICE A DAY. (PURPLE CAPSULE) What changed:   how much to take  how to take this  when to take this  additional instructions   cetirizine 10 MG tablet Commonly known as: ZYRTEC Take 10 mg by mouth daily.   clonazePAM 1 MG tablet Commonly known as: KLONOPIN Take 1 tablet (1 mg total) by mouth 2 (two) times daily as needed for anxiety. What changed: when to take this   cloNIDine 0.1 MG tablet Commonly known as: CATAPRES TAKE ONE HALF (1/2) TABLET BY MOUTH 3 TIMES DAILY AND 1 TABLET EVERY EVENING AT 9PM. (ROUND WHITE TAB WITH U 135) What changed:   how much to take  how to take this  when to take this  additional instructions     escitalopram 20 MG tablet Commonly known as: LEXAPRO Take 20 mg by mouth daily.   fluticasone 50 MCG/ACT nasal spray Commonly known as: FLONASE Place 2 sprays into both nostrils daily.   LINZESS PO Take by mouth daily.   multivitamin tablet Take 1 tablet by mouth daily.   naproxen sodium 220 MG tablet Commonly known as: ALEVE Take 220 mg by mouth daily as needed (pain).   nitrofurantoin (macrocrystal-monohydrate) 100 MG capsule Commonly known as: MACROBID Take 100 mg by mouth 2 (two) times daily.   polyethylene glycol 17 g packet Commonly known as: MIRALAX / GLYCOLAX Take 17 g by mouth daily at 6 PM.   pseudoephedrine 120 MG 12 hr tablet Commonly known as: SUDAFED Take 120 mg by mouth every 12 (twelve) hours as needed for congestion.    The medication list was reviewed and reconciled. All changes or newly prescribed medications were explained.  A complete medication list was provided to the patient/caregiver.  Mina Marble, [place], PGY2 11/30/2019 1:09 PM   I supervised Dr. Tarry Kos and agree with her observations and documentation except as amended I performed physical examination, participated in history taking, and guided decision making.  Princess Bruins. Gaynell Face, MD

## 2019-12-01 NOTE — Addendum Note (Signed)
Addended by: Deetta Perla on: 12/01/2019 02:14 PM   Modules accepted: Orders

## 2019-12-05 ENCOUNTER — Encounter: Payer: Self-pay | Admitting: Pediatrics

## 2019-12-08 ENCOUNTER — Encounter (INDEPENDENT_AMBULATORY_CARE_PROVIDER_SITE_OTHER): Payer: Self-pay

## 2020-05-04 ENCOUNTER — Ambulatory Visit (INDEPENDENT_AMBULATORY_CARE_PROVIDER_SITE_OTHER): Payer: Medicaid Other | Admitting: Pediatrics

## 2020-06-08 ENCOUNTER — Other Ambulatory Visit (INDEPENDENT_AMBULATORY_CARE_PROVIDER_SITE_OTHER): Payer: Self-pay | Admitting: Pediatrics

## 2020-06-08 DIAGNOSIS — F840 Autistic disorder: Secondary | ICD-10-CM

## 2020-07-09 ENCOUNTER — Other Ambulatory Visit (INDEPENDENT_AMBULATORY_CARE_PROVIDER_SITE_OTHER): Payer: Self-pay | Admitting: Pediatrics

## 2020-07-09 DIAGNOSIS — G40209 Localization-related (focal) (partial) symptomatic epilepsy and epileptic syndromes with complex partial seizures, not intractable, without status epilepticus: Secondary | ICD-10-CM

## 2020-11-25 ENCOUNTER — Encounter (INDEPENDENT_AMBULATORY_CARE_PROVIDER_SITE_OTHER): Payer: Self-pay

## 2021-01-17 ENCOUNTER — Other Ambulatory Visit (INDEPENDENT_AMBULATORY_CARE_PROVIDER_SITE_OTHER): Payer: Self-pay | Admitting: Pediatrics

## 2021-01-17 DIAGNOSIS — G40209 Localization-related (focal) (partial) symptomatic epilepsy and epileptic syndromes with complex partial seizures, not intractable, without status epilepticus: Secondary | ICD-10-CM

## 2021-02-01 ENCOUNTER — Other Ambulatory Visit (INDEPENDENT_AMBULATORY_CARE_PROVIDER_SITE_OTHER): Payer: Self-pay | Admitting: Pediatrics

## 2021-02-01 DIAGNOSIS — G40209 Localization-related (focal) (partial) symptomatic epilepsy and epileptic syndromes with complex partial seizures, not intractable, without status epilepticus: Secondary | ICD-10-CM

## 2021-02-01 NOTE — Telephone Encounter (Signed)
I sent a message to the scheduler to contact [facility] for an appointment. TG

## 2021-02-07 ENCOUNTER — Encounter (INDEPENDENT_AMBULATORY_CARE_PROVIDER_SITE_OTHER): Payer: Self-pay | Admitting: Pediatrics

## 2021-03-20 ENCOUNTER — Other Ambulatory Visit: Payer: Self-pay | Admitting: Family

## 2021-03-20 DIAGNOSIS — G40209 Localization-related (focal) (partial) symptomatic epilepsy and epileptic syndromes with complex partial seizures, not intractable, without status epilepticus: Secondary | ICD-10-CM

## 2021-03-20 NOTE — Telephone Encounter (Signed)
That she will need to return visit.  I can see him later in the month.  Long-term he will stay with the practice and I probably will refer him to [facility].

## 2021-04-12 IMAGING — MR MR HEAD WO/W CM
4 of 16 series · 7 of 48 positions shown · IV contrast (Yes GAD)
Comparison: None.

CLINICAL DATA: 33-year-old male with autism spectrum disorder,
language and intellectual disability. Patient is developed
progressive problems with gait, loss of continence, and inability to
take care of his activities of daily living. Partial epilepsy.

EXAM:
MRI HEAD WITHOUT AND WITH CONTRAST
TECHNIQUE: Multiplanar, multiecho pulse sequences of the brain and surrounding
structures were obtained without and with intravenous contrast.
CONTRAST:  5mL GADAVIST GADOBUTROL 1 MMOL/ML IV SOLN

[Series 5: FLAIR · sagittal · 5.0mm · 0.23mm/px · 2 of 25 slices shown (1 of 3)]
[im 1/25]
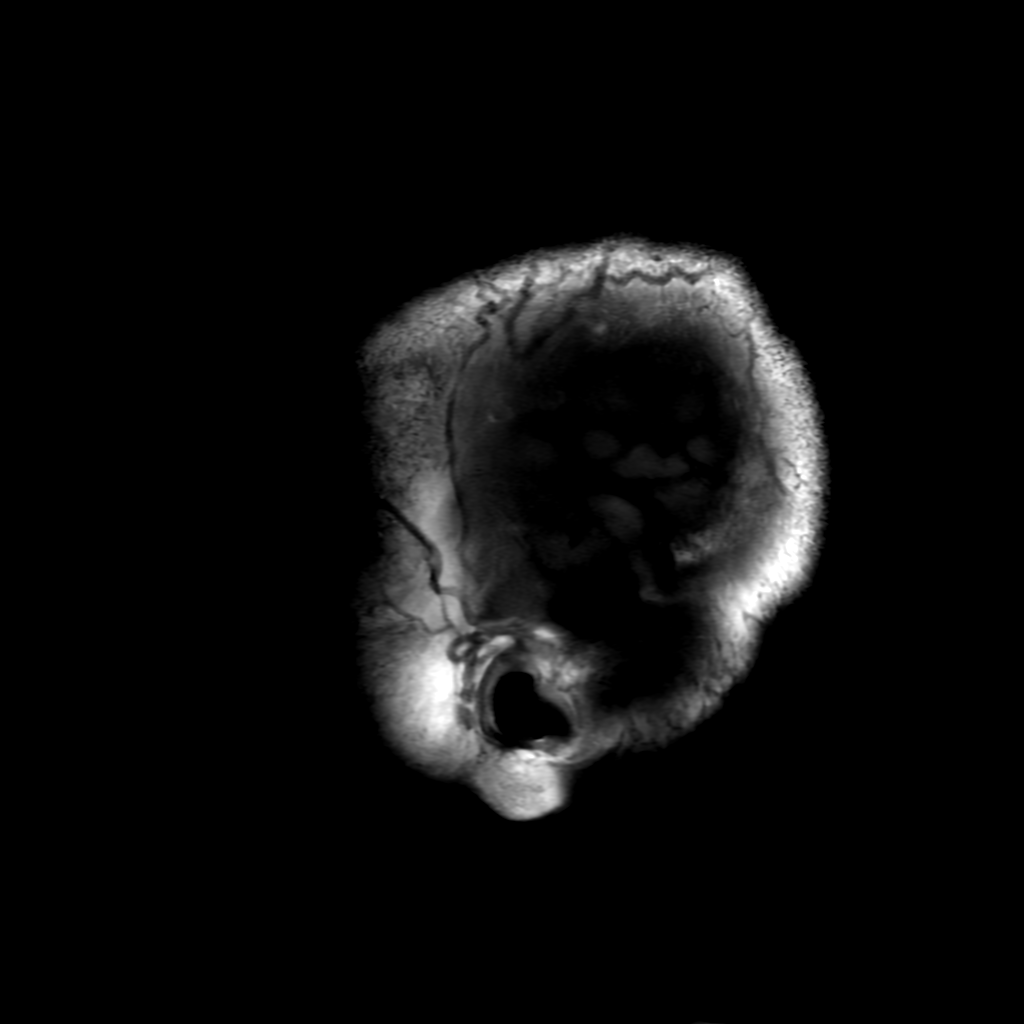
[im 25/25]
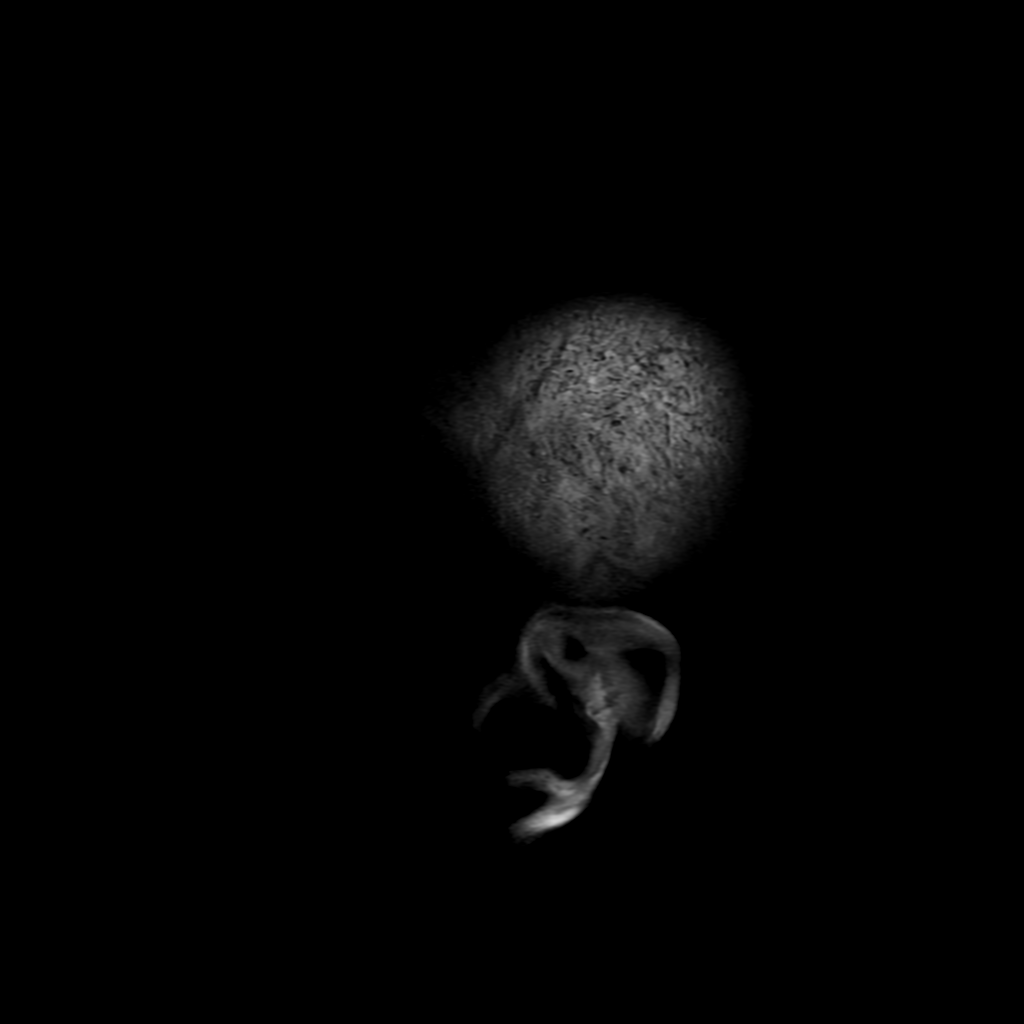

[Series 7: FLAIR · axial · 3.0mm · 0.47mm/px · z∈[-33,+122]mm · 2 of 27 slices shown (2 of 3)]
[im 1/27]
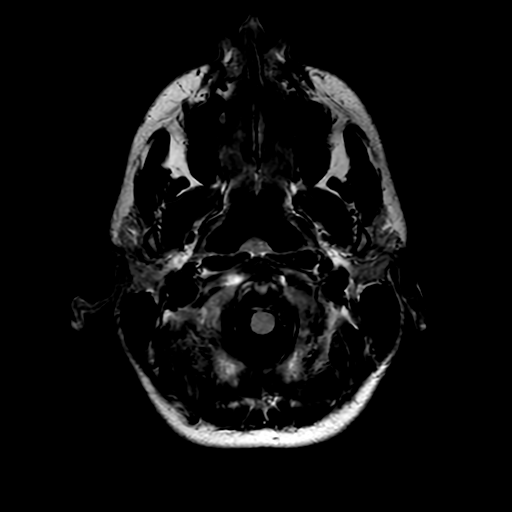
[im 27/27]
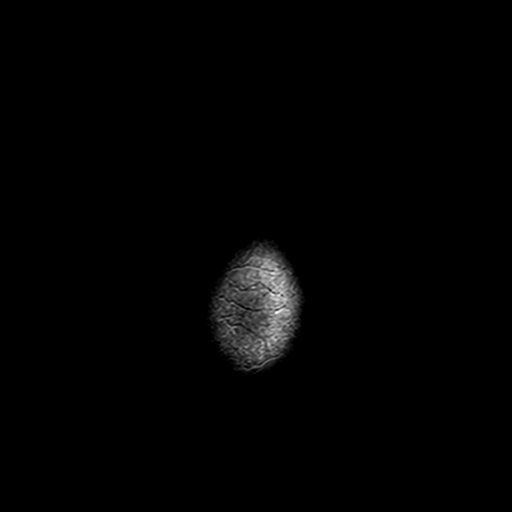

[Series 10: FLAIR · coronal · 3.0mm · 0.35mm/px · 2 of 29 slices shown (3 of 3)]
[im 1/29]
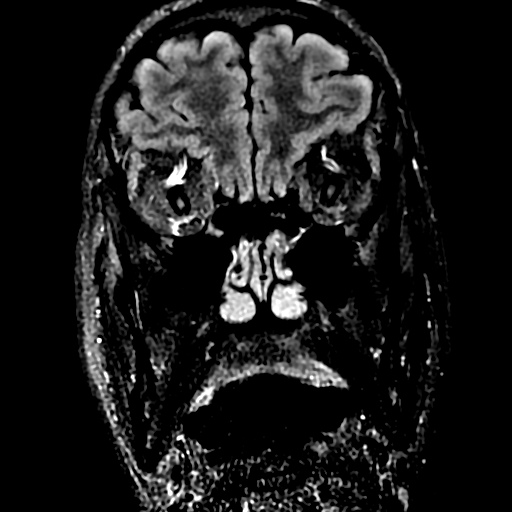
[im 29/29]
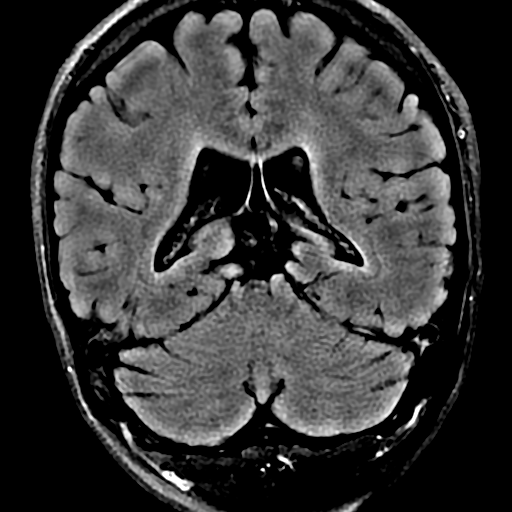

[Series 17: FLAIR post-contrast · sagittal · 5.0mm · 0.23mm/px · 1 of 25 slices shown]
[im 1/25]
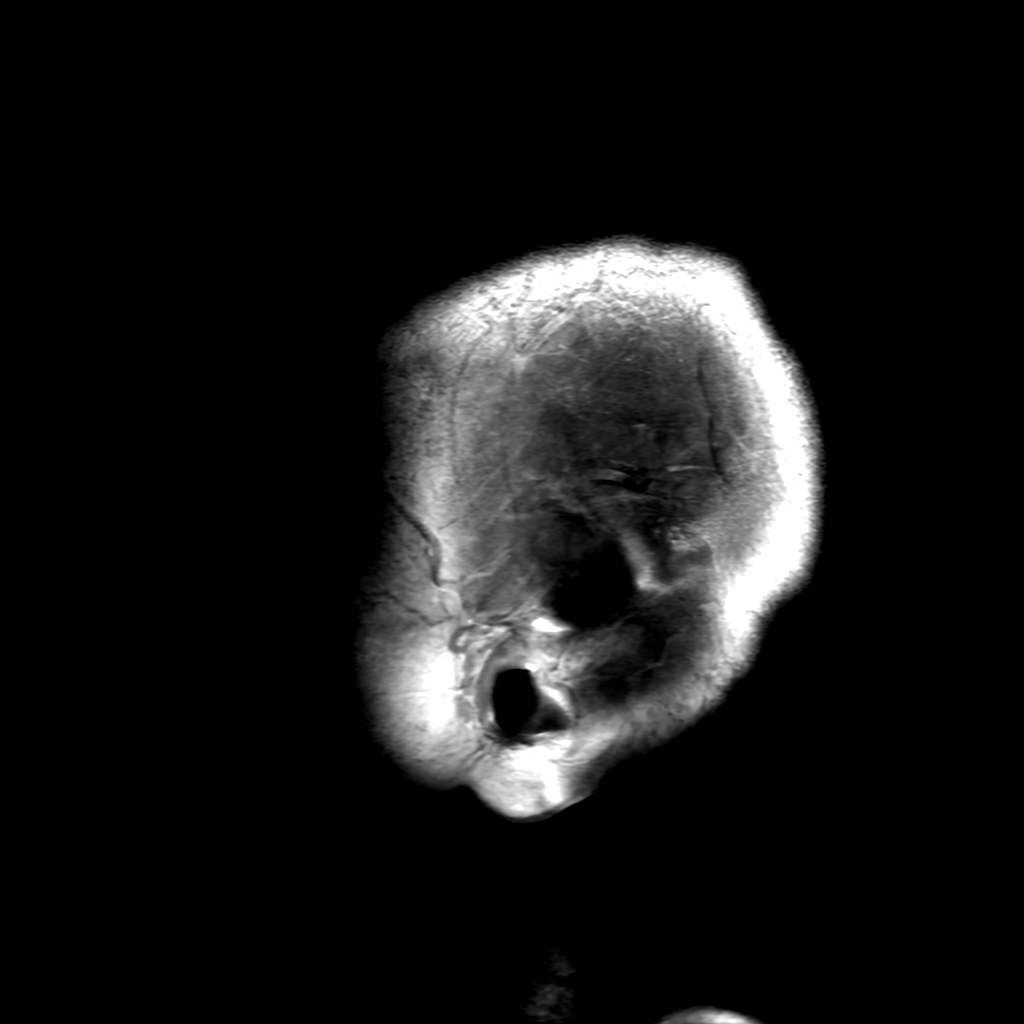

[7 of 48 positions shown; findings below may reference images not displayed]

FINDINGS: Brain: Midline structures appear normally formed, cavum septum
pellucidum (normal variant). Mild prominence of the lateral
ventricles for age seems related to a degree of generalized cerebral
volume loss. No disproportionate areas of brain atrophy are
identified.

No restricted diffusion to suggest acute infarction. No midline
shift, mass effect, evidence of mass lesion, ventriculomegaly,
extra-axial collection or acute intracranial hemorrhage.
Cervicomedullary junction and pituitary are within normal limits.

The left hippocampal formation appears slightly smaller, but the
left dentate gyrus still remains visible (series 13, image 7). No
definite asymmetric T2 or FLAIR hyperintensity. Other mesial
temporal lobe structures appear symmetric and within normal limits.

No encephalomalacia or chronic cerebral blood products identified.
Gray and white matter signal appears within normal limits for age.

No abnormal enhancement identified. No dural thickening.

Vascular: Major intracranial vascular flow voids are preserved. The
major dural venous sinuses are enhancing and appear to be patent.

Skull and upper cervical spine: Negative visible cervical spine,
spinal cord. Visualized bone marrow signal is within normal limits.

Sinuses/Orbits: Negative orbits. Trace paranasal sinus mucosal
thickening.

Other: Visible internal auditory structures appear normal. Mastoids
are clear. Normal stylomastoid foramina. Pharyngeal airway in place.
IMPRESSION: 1. No acute intracranial abnormality.
2. Subtle asymmetric volume loss of the left hippocampal formation.
Background age advanced but otherwise generalized cerebral volume
loss.
3. But no focal encephalomalacia, abnormal signal or enhancement
identified in the brain.

## 2021-04-18 ENCOUNTER — Other Ambulatory Visit (INDEPENDENT_AMBULATORY_CARE_PROVIDER_SITE_OTHER): Payer: Self-pay | Admitting: Pediatrics

## 2021-04-18 DIAGNOSIS — G40209 Localization-related (focal) (partial) symptomatic epilepsy and epileptic syndromes with complex partial seizures, not intractable, without status epilepticus: Secondary | ICD-10-CM

## 2021-04-18 NOTE — Telephone Encounter (Signed)
I left a message for mother to call for an appointment with Elveria Rising.  I will refill this for 1 more month.  I told her that if she did not bring him for an appointment that we would stop refilling his medication.

## 2021-05-20 ENCOUNTER — Other Ambulatory Visit (INDEPENDENT_AMBULATORY_CARE_PROVIDER_SITE_OTHER): Payer: Self-pay | Admitting: Family

## 2021-05-20 DIAGNOSIS — G40209 Localization-related (focal) (partial) symptomatic epilepsy and epileptic syndromes with complex partial seizures, not intractable, without status epilepticus: Secondary | ICD-10-CM

## 2021-05-20 MED ORDER — CARBAMAZEPINE ER 300 MG PO CP12: ORAL_CAPSULE | ORAL | 0 refills | Status: DC

## 2021-05-22 ENCOUNTER — Ambulatory Visit (INDEPENDENT_AMBULATORY_CARE_PROVIDER_SITE_OTHER): Payer: Medicaid Other | Admitting: Pediatrics

## 2021-05-22 ENCOUNTER — Other Ambulatory Visit: Payer: Self-pay

## 2021-05-22 ENCOUNTER — Encounter (INDEPENDENT_AMBULATORY_CARE_PROVIDER_SITE_OTHER): Payer: Self-pay | Admitting: Pediatrics

## 2021-05-22 VITALS — BP 120/70 | HR 88

## 2021-05-22 DIAGNOSIS — G40209 Localization-related (focal) (partial) symptomatic epilepsy and epileptic syndromes with complex partial seizures, not intractable, without status epilepticus: Secondary | ICD-10-CM

## 2021-05-22 DIAGNOSIS — F840 Autistic disorder: Secondary | ICD-10-CM | POA: Diagnosis not present

## 2021-05-22 DIAGNOSIS — R638 Other symptoms and signs concerning food and fluid intake: Secondary | ICD-10-CM

## 2021-05-22 DIAGNOSIS — F411 Generalized anxiety disorder: Secondary | ICD-10-CM | POA: Diagnosis not present

## 2021-05-22 NOTE — Patient Instructions (Signed)
I had the pleasure of seeing Roberto Ramirez today for neurology consultation for autism and history of epilepsy. Berk was accompanied by his aid who provided historical information.    Plan: Continue clonidine and carbamazepine Reviewed his blood lab results.  Referral to neuropsychiatry Dr. Thedore Mins.  Follow up in 6 months  Will work on transition to adult neurology.  Call neurology for any questions or concern

## 2021-05-22 NOTE — Progress Notes (Signed)
Patient: Roberto Ramirez MRN: [id number] Sex: male DOB: [birth year]  Provider: Franco Nones, MD Location of Care: Pediatric Specialist- Pediatric Neurology Note type: Progress note  History of Present Illness: Referral Source: Imagene Riches, NP Date of Evaluation: 05/22/2021  Chief Complaint: Autism spectrum disorder with language impairment, intellectual disability that requiring very substantial support.  Interim history: Roberto Ramirez is a 35 y.o. male with history significant for  has a past medical history of Abnormal gait, Anxiety, Autism spectrum disorder requiring very substantial support (level 3), Constipation, Depression, Non-verbal learning disorder, Seasonal allergies, Seizures ([place]), and Uses wheelchair. presenting for evaluation of history of seizure disorder, autism spectrum disorder, and severe anxiety.    Patient's caregiver presents with the patient and reports that after his previous visit on 11/30/2019, his diet increased but has worsened again.  She has a list of concerns including that his diet has been poor, he is been having difficulty standing and prefers to sit on the floor.  She also reports his anxiety has been extremely elevated over the last few months.  He also had behavioral concerns including making crying noises when he is having a lot of anxiety, he will intermittently say ouch but she cannot identify what is hurting, he will grab himself, he will groan, he will throw things, he is not enjoying activities that he wants participated in.  At the previous visit Dr. Gaynell Face ordered CBC with differential, CMP, CK, ESR, folate, B12, TSH, T3/T4.  The lab work was significant for a decreased blood glucose of 58 but was otherwise normal.  There was discussion regarding a nutritionist evaluation and the patient agreed to do at the previous visit but reports that after that visit his intake increased and says she is thought there was no need for 1.  She is okay  with having a referral placed to speak with a nutritionist.  Past Medical History:  Diagnosis Date   Abnormal gait    Anxiety    Autism spectrum disorder requiring very substantial support (level 3)        Constipation    Depression    Non-verbal learning disorder    Seasonal allergies    Seizures ([place])    last seizure yrs ago in high school, none as adult controlled with meds   Uses wheelchair    as needed when pt goes shoppjng. medical appts, trips,  Patient can walk but abn gait.    Past Surgical History:  Procedure Laterality Date   CIRCUMCISION  1989   RADIOLOGY WITH ANESTHESIA N/A 11/22/2019   Procedure: MRI WITH ANESTHESIA BRAIN WITH AND WIHTOUT;  Surgeon: Radiologist, Medication, MD;  Location: [facility];  Service: Radiology;  Laterality: N/A;    Allergy: No Known Allergies  Medications: Carbamazepine 600 mg twice a day Clonidine 0.05 mg 4 times daily Klonopin 1 mg 3 times a day   Birth History   Birth History    7 lbs. 9 oz. born at full-term to a 33 year old primigravida after an uncomplicated pregnancy.   Mother had dental films during gestation which was otherwise uncomplicated.   Labor lasted for 26 hours. He had a normal spontaneous vaginal delivery. At birth he had some cold stress but no other history of problems.   He was breast-fed for 8 months.   He had developmental delay in his gross and fine motor milestones but in particular language and socialization.    Developmental history: Patient has known diagnosis of autism spectrum disorder requiring considerable  amount of care.  Schooling: Patient does not attend school at this time.   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  Social and family history: Patient lives with a caregiver who has been with him since he was approximately 35 years old.  He still has relationships with his family.  Family History family history includes Cancer in his  maternal grandmother; Lung cancer in his paternal grandfather.  Review of Systems  Constitutional:  Negative for chills and fever.  Neurological:  Positive for tremors, speech difficulty and weakness.  Psychiatric/Behavioral:  Positive for agitation and behavioral problems. The patient is nervous/anxious.    EXAMINATION Physical examination: BP 120/70   Pulse 88   General examination: he is alert but not interactive.  He is sitting on the floor watching YouTube videos with me in the room.  He becomes agitated intermittently throughout the evaluation.. Chest examination reveals normal breath sounds, and normal heart sounds with no cardiac murmur.  Abdominal examination shows no abnormalities. Neurologic examination: he is awake, alert, he does not follow commands and does not speak throughout the evaluation.  He occasionally moans or cracks.  He becomes irritated and anxious intermittently throughout the exam Cranial nerves: Grossly intact.  Difficult to assess due to behavioral concerns Motor assessment: The tone is mildly increased.  Movements are symmetric in all four extremities, no visual signs of weakness although patient repeatedly sits down on the floor. Deep tendon reflexes are 2+ and symmetric at the biceps, and knees.   Sensory examination: Could not evaluate due to intellectual disability Co-ordination and gait: Difficult to evaluate due to neurological disability.  Patient is able to stand but requires assistance getting up.  Once up he can stand on his own.  He is able to ambulate with difficulty and patient's caregiver reports that he is usually wheelchair-bound.  Work-up: MRI brain with and without contrast 11/22/2019: 1. No acute intracranial abnormality. 2. Subtle asymmetric volume loss of the left hippocampal formation.Background age advanced but otherwise generalized cerebral volume loss. 3. But no focal encephalomalacia, abnormal signal or enhancement identified in the  brain.   Assessment and Plan Roberto Ramirez is a 35 y.o. male with history of severe anxiety, autism spectrum disorder, intellectual disability requiring very substantial support, depression, nonverbal learning disorder, seizures, abnormal gait who presents for a number of concerns including but not limited to difficulties with ambulation and standing, anxiety, behavioral concerns.  Caregiver reports that he has been seizure-free since 2003.  PLAN: For seizure management, patient will continue carbamazepine on current dose 600 mg twice a day. Previously clonidine prescribed by Dr. Gaynell Face half tablet of 0.1 mg 4 times a day. Patient's lab work reviewed today and it was noted that he was hypoglycemic, we will contact nutritionist to help patient with dietary needs. Referral for neuropsychiatry placed with Dr.Mojeed Akintayo Follow-up in 6 months We will initiate transition to adult neurology   Counseling/Education: Provided    The plan of care was discussed, with acknowledgement of understanding expressed by his mother.   I spent 40 minutes with the patient and provided 50% counseling  Franco Nones, MD Neurology and epilepsy attending [facility] child [facility]

## 2021-05-23 MED ORDER — CARBAMAZEPINE ER 300 MG PO CP12: ORAL_CAPSULE | ORAL | 6 refills | Status: DC

## 2021-05-23 MED ORDER — CLONIDINE HCL 0.1 MG PO TABS: ORAL_TABLET | ORAL | 0 refills | Status: DC

## 2021-05-26 NOTE — Addendum Note (Signed)
Addended by: Lezlie Lye on: 05/26/2021 07:36 PM   Modules accepted: Orders

## 2021-06-11 ENCOUNTER — Encounter (INDEPENDENT_AMBULATORY_CARE_PROVIDER_SITE_OTHER): Payer: Self-pay

## 2021-06-17 ENCOUNTER — Other Ambulatory Visit (INDEPENDENT_AMBULATORY_CARE_PROVIDER_SITE_OTHER): Payer: Self-pay | Admitting: Family

## 2021-06-17 DIAGNOSIS — G40209 Localization-related (focal) (partial) symptomatic epilepsy and epileptic syndromes with complex partial seizures, not intractable, without status epilepticus: Secondary | ICD-10-CM

## 2021-07-11 ENCOUNTER — Ambulatory Visit (INDEPENDENT_AMBULATORY_CARE_PROVIDER_SITE_OTHER): Payer: Medicaid Other | Admitting: Dietician

## 2021-07-29 NOTE — Progress Notes (Signed)
I  Medical Nutrition Therapy - Initial Assessment Appt start time: 3:04 PM Appt end time: 4:00 PM  Reason for referral: Autism Spectrum Disorder with accomypanying intellectual impairment requiring substantial support; alterative in nutrition Referring provider: Dr. Moody Bruins Pertinent medical hx: Epilepsy, ASD, anxiety , urinary/fecal incontinence  Assessment: Food allergies: none Pertinent Medications: see medication list - Catapres, Klonopin, Lexapro Vitamins/Supplements: none Pertinent labs: no recent labs in Epic  (1/16) Anthropometrics: Ht: 168 cm Wt: 50.9 kg (112#)  BMI: 18.0  IBW based on Hamwi Equation: 64.5 kg (+/- 10%)  11/30/19 Wt: 118 # (53.6 kg) 10/31/19 Wt: 123 # (55.4 kg) 08/09/18 Wt: 117# (53.2 kg) 11/13/16 Wt: 122 # (55.4 kg) 11/19/15 Wt: 121 # (55 kg)  Estimated minimum caloric needs: 1400 kcal/day ([place]) Estimated minimum protein needs: 41 g/day (0.8 g/kg) Estimated minimum fluid needs: 1270-1780 mL/kg/day (25-35 mL/kg)  Primary concerns today: Consult given pt with ASD and picky eating. AFL and mom accompanied pt to appt today.   Dietary Intake Hx: Usual eating pattern includes: 2 meals and 0 snacks per day. Typically skips lunch.  Feeding Skills: fed by caretaker Chewing or swallowing difficulties with food or liquids: pocketing food  24-hr recall: Breakfast: 1-2 pancake + banana + a few sips of milk or juice.  Snack: none Lunch: none Snack: none Dinner: banquet meals (rice + vegetables + meats) + juice or water Snack: none   Typical Beverages: milk (2%), almond milk, juice, water   Notes: Per family, Kaelan has been regressing with eating since COVID started. Since COVID he has had a hard time with eating and drinking. His family notes that he consistently pockets food and will not consistently drink then will occasionally drink large amounts at night. Family did not think Archer has had an MBS in the past but do not feel he would do  well with one as he doesn't like people to watch him eat. His caretaker feeds him for all of his meals noting he won't eat well unless she feeds him. He enjoys softer, mushy textured foods. He is currently drinking out of an open cup and is unable to drink from a straw.   Physical Activity: delayed   GI: every 2-3 days (juice and Miralax)  GU: 2-3x/day (dark urine frequently)  Estimated intake likely not meeting needs given weight loss and caretaker report of decreased appetite.   Pt consuming various food groups.  Pt likely consuming inadequate amounts of fluids, dairy and protein.  Nutrition Diagnosis: (1/16) Inadequate energy intake related to suspected dysphagia and swallowing difficulties as evidenced by reported weight loss and difficulties with eating.   Intervention: Discussed pt's weight trends and current intake. Discussed importance of adequate hydration and nutrition. Discussed need for  meeting with feeding therapist to best assess oral motor skills and provide advice on increasing amount eaten and fluids consumed. Discussed recommendations below. All questions answered, family in agreement with plan.   Nutrition Recommendations: - Continue trying to get as much fluid in [place] as possible. Offer water throughout the day and high water containing foods (fruits, vegetables, etc) - Put gravy and sauces on foods to help with [facility] swallowing easier.  - Offer 2% milk or chocolate milk with all meals and water in between meals.  - Goal for 1 Ensure per day OR 6-8 scoops of Duocal. I will put in an order for this.  - Add extra calories where you're able to (1 tsp oil, butter).   Handouts Given: - High  Calorie, High Protein Foods List   Teach back method used.  Monitoring/Evaluation: Continue to Monitor: - Growth trends - Dietary intake  - Ability to try new foods  Follow-up in 1 month in feeding clinic .  Total time spent in counseling: 56 minutes.

## 2021-08-05 ENCOUNTER — Ambulatory Visit (INDEPENDENT_AMBULATORY_CARE_PROVIDER_SITE_OTHER): Payer: Medicaid Other | Admitting: Dietician

## 2021-08-05 ENCOUNTER — Other Ambulatory Visit: Payer: Self-pay

## 2021-08-05 ENCOUNTER — Encounter (INDEPENDENT_AMBULATORY_CARE_PROVIDER_SITE_OTHER): Payer: Self-pay | Admitting: Dietician

## 2021-08-05 VITALS — Ht 66.14 in | Wt 112.2 lb

## 2021-08-05 DIAGNOSIS — F840 Autistic disorder: Secondary | ICD-10-CM | POA: Diagnosis not present

## 2021-08-05 DIAGNOSIS — R630 Anorexia: Secondary | ICD-10-CM

## 2021-08-05 DIAGNOSIS — R633 Feeding difficulties, unspecified: Secondary | ICD-10-CM

## 2021-08-05 DIAGNOSIS — R638 Other symptoms and signs concerning food and fluid intake: Secondary | ICD-10-CM

## 2021-08-05 MED ORDER — NUTRITIONAL SUPPLEMENT PLUS PO LIQD: ORAL | 12 refills | Status: DC

## 2021-08-05 MED ORDER — RA NUTRITIONAL SUPPORT PO POWD: ORAL | 12 refills | Status: DC

## 2021-08-05 NOTE — Progress Notes (Signed)
New orders for 6-8 scoops Duocal and 1 Ensure Original sent to [facility] @ [phone].

## 2021-08-05 NOTE — Patient Instructions (Addendum)
Nutrition Recommendations: - Continue trying to get as much fluid in [place] as possible. Offer water throughout the day and high water containing foods (fruits, vegetables, etc) - Put gravy and sauces on foods to help with [facility] swallowing easier.  - Offer 2% milk or chocolate milk with all meals and water in between meals.  - Goal for 1 Ensure per day OR 6-8 scoops of Duocal. I will put in an order for this.  - Add extra calories where you're able to (1 tsp oil, butter).   Next appointment will be February 20th @ 3:30 for feeding clinic.

## 2021-08-06 ENCOUNTER — Encounter (INDEPENDENT_AMBULATORY_CARE_PROVIDER_SITE_OTHER): Payer: Medicaid Other | Admitting: Speech Pathology

## 2021-08-27 NOTE — Progress Notes (Signed)
Medical Nutrition Therapy - Progress Note Appt start time: 3:25 PM Appt end time: 4:23 PM Reason for referral: Feeding difficulties, ASD, dysphagia Referring provider: Dr. Moody Bruins  Overseeing provider: Dr. Moody Bruins - Feeding Clinic Pertinent medical hx: Epilepsy, ASD, anxiety , urinary/fecal incontinence  Assessment: Food allergies: none  Pertinent Medications: see medication list Vitamins/Supplements: none Pertinent labs: no recent labs in epic  (2/20) Anthropometrics: Ht: 168 cm Wt: 55 kg (121#)  BMI: 19.3 IBW based on Hamwi Equation: 64.5 kg (+/- 10%)  (1/16) Anthropometrics: Ht: 168 cm Wt: 50.9 kg (112#)  BMI: 18.0  IBW based on Hamwi Equation: 64.5 kg (+/- 10%)   11/30/19 Wt: 118 # (53.6 kg) 10/31/19 Wt: 123 # (55.4 kg) 08/09/18 Wt: 117# (53.2 kg) 11/13/16 Wt: 122 # (55.4 kg) 11/19/15 Wt: 121 # (55 kg)  Estimated minimum caloric needs: 2129 kcal/kg/day (EER) Estimated minimum protein needs: 44 g/kg/day (0.8 g/kg) Estimated minimum fluid needs: 1375-1925 mL/kg/day (25-35 mL/kg)  Primary concerns today: Consult given pt with ASD and picky eating. RD familiar with patient from neurology. AFL accompanied pt to appt today. Appt in conjunction with Jeb Levering, SLP.  Dietary Intake Hx: DME: [facility] Usual eating pattern includes: 2-3 meals and 0 snacks per day. Typically skips lunch. Meal duration: 1 hour  Feeding skills: fed by caretaker  Chewing/swallowing difficulties with foods or liquids: drooling with liquids and occasionally coughing  Texture modifications: chopped    24-hr recall: Breakfast: 2 eggs + grits + 4 pieces of bacon + 1 biscuit + 1 banana Snack: none Lunch: 1 pb + jelly sandwich  Snack: none Dinner: chicken + rice + mixed vegetables + 2.5 biscuits + 2 glasses of juice  Snack: none    Typical Beverages: milk (2%)/almond milk/juice/ water (2-3 cups/day) Nutrition Supplements: 1 Ensure/Boost, 6-8 scoops Duocal  Current Therapies:  none  Physical Activity: delayed  GI: every 2-3 days (juice and Miralax daily)  GU: 2-3x/day (dark urine daily)   Estimated intake likely not meeting needs given weight loss and caretaker report of decreased appetite.   Pt consuming various food groups.  Pt likely consuming inadequate amounts of fluids, dairy and protein.  Nutrition Diagnosis: (1/16) Inadequate energy intake related to suspected dysphagia and swallowing difficulties as evidenced by reported weight loss and difficulties with eating.   Intervention: Discussed pt's weight and current intake. Caregiver expressed concern for boost and ensure being on backorder. RD will place an order for [facility] Standard 1.0 as alternative. Discussed recommendations below. RD very concerned with fluid Abelino is consuming and caregiver's report of dark urine. All questions answered, family in agreement with plan.   Nutrition and SLP Recommendations: - Continue adding 6-8 scoops of duocal to Panagiotis's foods per day.  - I will put in an order for a substitute to Ensure [employer], [employer], etc).  - Give Clovis Riley either a nutrition supplement OR 6-8 scoops of duocal per day.  - Add 1 tablespoon applesauce, puree or yogurt or 1 packet of ThickIt to 4 oz of Dujuan's liquids to help thicken his liquids.  - Offer 1 spoon of liquid after each bite of food to help him with getting in more liquids when he doesn't want to drink any fluids.  Handouts Given at Previous Appointments: - High Calorie, High Protein Foods List  Teach back method used.  Monitoring/Evaluation: Goals to Monitor: - Weight trends - Dietary intake  - Hydration status  Follow-up in 4 months, joint with Jeb Levering, SLP (June 12th @ 3:30  PM).  Total time spent in counseling: 58 minutes.

## 2021-09-09 ENCOUNTER — Ambulatory Visit (INDEPENDENT_AMBULATORY_CARE_PROVIDER_SITE_OTHER): Payer: Medicaid Other | Admitting: Speech-Language Pathologist

## 2021-09-09 ENCOUNTER — Encounter (INDEPENDENT_AMBULATORY_CARE_PROVIDER_SITE_OTHER): Payer: Self-pay | Admitting: Dietician

## 2021-09-09 ENCOUNTER — Other Ambulatory Visit: Payer: Self-pay

## 2021-09-09 ENCOUNTER — Ambulatory Visit (INDEPENDENT_AMBULATORY_CARE_PROVIDER_SITE_OTHER): Payer: Medicaid Other | Admitting: Dietician

## 2021-09-09 VITALS — Wt 120.6 lb

## 2021-09-09 DIAGNOSIS — R633 Feeding difficulties, unspecified: Secondary | ICD-10-CM

## 2021-09-09 DIAGNOSIS — R1312 Dysphagia, oropharyngeal phase: Secondary | ICD-10-CM

## 2021-09-09 DIAGNOSIS — R638 Other symptoms and signs concerning food and fluid intake: Secondary | ICD-10-CM

## 2021-09-09 DIAGNOSIS — F840 Autistic disorder: Secondary | ICD-10-CM

## 2021-09-09 MED ORDER — NUTRITIONAL SUPPLEMENT PLUS PO LIQD: ORAL | 12 refills | Status: DC

## 2021-09-09 NOTE — Therapy (Signed)
SLP Feeding Evaluation Patient Details Name: Roberto Ramirez MRN: [id number] DOB: Jun 26, 1986 Today's Date: 09/09/2021  Appt start time: 3:25 PM Appt end time: 4:23 PM Reason for referral: Feeding difficulties, ASD, dysphagia Referring provider: Dr. Moody Bruins  Overseeing provider: Dr. Moody Bruins - Feeding Clinic Pertinent medical hx: Epilepsy, ASD, anxiety , urinary/fecal incontinence Visit Information: visit in conjunction with RD and NP. History of feeding difficulty to include autism with minimal liquid intake.   General Observations: Roberto Ramirez was seated next to primary caregiver. Thin male, with minimal eye contact. Minimal verbalizations.    Feeding concerns currently: Primary caretaker voiced concerns regarding Roberto Ramirez weight as well as how little Roberto Ramirez drinks liquids, often 1-2 cups/day total.  Feeding Session: Roberto Ramirez self fed a nutri-grain bar and accepted pieces of 2 rice krispy treats, along with Ensure liquid and [employer] liquid from a spoon. Attempts to get Roberto Ramirez to drink from a cup were unsuccessful but small sips were without overt s/x of aspiration or stress cues. Mastication appears immature with vertical chew and lingual mash. Weak contact with bites concerning for swallowing some pieces whole, particularly if solid food is a more complex texture.    Schedule consists of:  Dietary Intake Hx per RD DME: [facility] Usual eating pattern includes: 2-3 meals and 0 snacks per day. Typically skips lunch. Meal duration: 1 hour  Feeding skills: fed by caretaker  Chewing/swallowing difficulties with foods or liquids: drooling with liquids and occasionally coughing  Texture modifications: chopped     24-hr recall: Breakfast: 2 eggs + grits + 4 pieces of bacon + 1 biscuit + 1 banana Snack: none Lunch: 1 pb + jelly sandwich  Snack: none Dinner: chicken + rice + mixed vegetables + 2.5 biscuits + 2 glasses of juice  Snack: none    Typical Beverages: milk (2%)/almond  milk/juice/ water (2-3 cups/day) Nutrition Supplements: 1 Ensure/Boost, 6-8 scoops Duocal   Current Therapies: none  Stress cues: No coughing, choking or stress cues reported today though liquids, which are the concern at this time, were consumed only via spoon. All other methods (open cup, straw, mug, self fed, assistance with drinking etc.) were refused.     Clinical Impressions: Ongoing dysphagia c/b immature and inefficient oral skills marked by lingual mash without true rotary chew pattern with solids. Liquids were refused via cup. Skills appeared functional but were only accepted via spoon which is likely ineffective in supporting patient's full hydration needs.  Given refusal of thin liquids and caregivers report that Roberto Ramirez has started to drink Ensure via cup with increasing interest, SLP questions whether thicker viscosity liquids are easier for bolus containment or from a swallowing standpoint. Caregiver was encouraged to trial both spoon offering of thin liquids and thicker viscocity liquids (ie adding a natural thickener such as applesauce, or by using a thickening product).   Recommendations: (RD and SLP combined recommendations)   1. Continue adding 6-8 scoops of duocal to Roberto Ramirez's foods per day.  2. RD to place an order for a substitute to Ensure [employer], [employer], etc).  3. Roberto Ramirez either a nutrition supplement OR 6-8 scoops of duocal per day.  4. Add 1 tablespoon applesauce, puree or yogurt or 1 packet of ThickIt to 4 oz of Roberto Ramirez's liquids to help thicken liquids in an attempt to increase bolus control and acceptance.  5. Offer 1 spoon of liquid after every 2-4 bites of food as a liqudid wash and to increase liquids in general when Roberto Ramirez is refusing liquids in other ways.  Teach back method used.         Madilyn Hook MA, CCC-SLP, BCSS,CLC 09/09/2021, 6:48 PM

## 2021-09-09 NOTE — Patient Instructions (Addendum)
Nutrition and SLP Recommendations: - Continue adding 6-8 scoops of duocal to Roberto Ramirez's foods per day.  - I will put in an order for a substitute to Ensure Roberto Ramirez, [employer], etc).  - Give Roberto Ramirez either a nutrition supplement OR 6-8 scoops of duocal per day.  - Add 1 tablespoon applesauce, puree or yogurt or 1 packet of ThickIt to 4 oz of Roberto Ramirez liquids to help thicken his liquids.  - Offer 1 spoon of liquid after each bite of food to help him with getting in more liquids when he doesn't want to drink any fluids.  Next appointment with feeding team will be June 12th @ 3:30 PM.

## 2021-09-09 NOTE — Progress Notes (Signed)
RD faxed orders for 1 Boost/Ensure/[employer] Standard 1.0 or equivalent substitute to [facility] @ [phone].

## 2021-09-12 ENCOUNTER — Encounter (INDEPENDENT_AMBULATORY_CARE_PROVIDER_SITE_OTHER): Payer: Self-pay | Admitting: Family

## 2021-09-12 NOTE — Progress Notes (Signed)
Mailed to AFL provider. TG

## 2021-11-12 ENCOUNTER — Telehealth (INDEPENDENT_AMBULATORY_CARE_PROVIDER_SITE_OTHER): Payer: Self-pay | Admitting: Family

## 2021-11-12 NOTE — Telephone Encounter (Signed)
?  Name of who is calling:Michelle  ? ?Caller's Relationship to Patient:Mother  ? ?Best contact number:[phone] ? ?Provider they see:Dr.Abdelmoumen  ? ?Reason for call:mom called requesting a call back with questions she has regarding Renzo his care. Mom stated that she was told she did not need to bring [place] back and was referred to another office. Mom stated that they do not provide the care he needs or can write the medication he needs as well. Please advise  ? ? ? ? ?PRESCRIPTION REFILL ONLY ? ?Name of prescription: ? ?Pharmacy: ? ? ?

## 2021-11-13 NOTE — Telephone Encounter (Signed)
Left voicemail for mom to call back

## 2021-11-21 ENCOUNTER — Ambulatory Visit (INDEPENDENT_AMBULATORY_CARE_PROVIDER_SITE_OTHER): Payer: Medicaid Other | Admitting: Pediatrics

## 2021-11-25 ENCOUNTER — Ambulatory Visit (INDEPENDENT_AMBULATORY_CARE_PROVIDER_SITE_OTHER): Payer: Medicaid Other | Admitting: Family

## 2021-12-02 ENCOUNTER — Telehealth (INDEPENDENT_AMBULATORY_CARE_PROVIDER_SITE_OTHER): Payer: Self-pay | Admitting: Family

## 2021-12-02 NOTE — Telephone Encounter (Signed)
?  Name of who is calling:Roberto Ramirez  ? ?Caller's Relationship to Patient:Case Manager  ? ?Best contact [phone] ? ?Provider they see:Dr.Abdelmoumen  ? ?Reason for call:Roberto Ramirez with community support service asked if Roberto Ramirez could call her back regarding some questions she ash about Roberto Ramirez and a referral that was supposed to be sent out by Dr.Hickling to adult Neuro and the fmaily has some confusion and unclear on what they need to do to continue the care for [place].  ? ? ? ? ?PRESCRIPTION REFILL ONLY ? ?Name of prescription: ? ?Pharmacy: ? ? ?

## 2021-12-02 NOTE — Telephone Encounter (Signed)
See phone note from today. TG 

## 2021-12-02 NOTE — Telephone Encounter (Signed)
I called Roberto Ramirez and explained that Roberto Ramirez used to be seen by Dr Sharene Skeans who has now retired. He was then seen by Dr Moody Bruins, who planned to refer him to psychiatry and adult neurology, and is now on maternity leave. He has also been seen by the Feeding team and has an appointment with them on June 12th. Mom is confused about the referral to adult neurology and would prefer that he stays in this practice but wants a neurologist that is comfortable with his autism and seizure disorder. I offered to see him in joint visit with the Feeding Team on June 12th but Mom prefers MD over NP. I scheduled him with Dr Artis Flock on July 24th. Roberto Ramirez will talk with Mom and explain these things, and will call back if Mom disagrees with the plan. TG ?

## 2021-12-17 NOTE — Progress Notes (Incomplete)
   Medical Nutrition Therapy - Progress Note Appt start time: *** Appt end time: *** Reason for referral: Feeding difficulties, ASD, dysphagia Referring provider: Dr. Moody Bruins  Overseeing provider: Dr. Moody Bruins - Feeding Clinic Pertinent medical hx: Epilepsy, ASD, anxiety , urinary/fecal incontinence  Assessment: Food allergies: none  Pertinent Medications: see medication list Vitamins/Supplements: none Pertinent labs: no recent labs in epic  (***) Anthropometrics: Ht: 168 cm Wt: *** kg (***#)  BMI: *** IBW based on Hamwi Equation: *** kg (+/- 10%)  (2/20) Anthropometrics: Ht: 168 cm Wt: 55 kg (121#)  BMI: 19.3 IBW based on Hamwi Equation: 64.5 kg (+/- 10%)   2/20 Wt: 121# (55 kg) 1/16 Wt: 112 # (50.9 kg) 11/30/19 Wt: 118 # (53.6 kg) 10/31/19 Wt: 123 # (55.4 kg) 08/09/18 Wt: 117# (53.2 kg) 11/13/16 Wt: 122 # (55.4 kg) 11/19/15 Wt: 121 # (55 kg)  Estimated minimum caloric needs: 2129 kcal/kg/day (EER) *** Estimated minimum protein needs: *** g/kg/day (0.8 g/kg) Estimated minimum fluid needs: 1375-1925 mL/kg/day (25-35 mL/kg) ***  Primary concerns today: Follow-up given pt with ASD and picky eating. AFL accompanied pt to appt today. Appt in conjunction with Jeb Levering, SLP.  Dietary Intake Hx: DME: [facility] Usual eating pattern includes: 2-3 meals and 0 snacks per day. Typically skips lunch. Meal duration: 1 hour  Feeding skills: fed by caretaker  Chewing/swallowing difficulties with foods or liquids: drooling with liquids and occasionally coughing  Texture modifications: chopped    24-hr recall: *** Breakfast: 2 eggs + grits + 4 pieces of bacon + 1 biscuit + 1 banana Snack: none Lunch: 1 pb + jelly sandwich  Snack: none Dinner: chicken + rice + mixed vegetables + 2.5 biscuits + 2 glasses of juice  Snack: none    Typical Beverages: milk (2%)/almond milk/juice/ water (2-3 cups/day) *** Nutrition Supplements: 1 Ensure/Boost, 6-8 scoops Duocal ***  Current  Therapies: none  Physical Activity: delayed  GI: every 2-3 days (juice and Miralax daily) *** GU: 2-3x/day (dark urine daily) ***   Estimated intake likely not meeting needs given weight loss and caretaker report of decreased appetite.  *** Pt consuming various food groups.  Pt likely consuming inadequate amounts of fluids, dairy and protein. ***  Nutrition Diagnosis: (1/16) Inadequate energy intake related to suspected dysphagia and swallowing difficulties as evidenced by reported weight loss and difficulties with eating.   Intervention: Discussed pt's weight and current intake. Discussed recommendations below. All questions answered, family in agreement with plan.   Nutrition and SLP Recommendations: - ***  Handouts Given at Previous Appointments: - High Calorie, High Protein Foods List  Teach back method used.  Monitoring/Evaluation: Goals to Monitor: - Weight trends - Dietary intake  - Hydration status  Follow-up in ***.  Total time spent in counseling: *** minutes.

## 2021-12-27 ENCOUNTER — Other Ambulatory Visit (INDEPENDENT_AMBULATORY_CARE_PROVIDER_SITE_OTHER): Payer: Self-pay | Admitting: Family

## 2021-12-27 DIAGNOSIS — G40209 Localization-related (focal) (partial) symptomatic epilepsy and epileptic syndromes with complex partial seizures, not intractable, without status epilepticus: Secondary | ICD-10-CM

## 2021-12-27 MED ORDER — CARBAMAZEPINE ER 300 MG PO CP12: ORAL_CAPSULE | ORAL | 1 refills | Status: DC

## 2021-12-30 ENCOUNTER — Ambulatory Visit (INDEPENDENT_AMBULATORY_CARE_PROVIDER_SITE_OTHER): Payer: Medicaid Other | Admitting: Dietician

## 2021-12-30 ENCOUNTER — Encounter (INDEPENDENT_AMBULATORY_CARE_PROVIDER_SITE_OTHER): Payer: Medicaid Other | Admitting: Speech-Language Pathologist

## 2022-01-15 NOTE — Progress Notes (Signed)
Medical Nutrition Therapy - Progress Note Appt start time: 9:44 AM  Appt end time: 10:07 AM  Reason for referral: Feeding difficulties, ASD, dysphagia Referring provider: Dr. Moody Bruins  Overseeing provider: Dr. Moody Bruins - Feeding Clinic Pertinent medical hx: Epilepsy, ASD, anxiety , urinary/fecal incontinence  Assessment: Food allergies: none  Pertinent Medications: see medication list Vitamins/Supplements: vitamin D  Pertinent labs: no recent labs in epic  (7/12) Anthropometrics: Ht: 168 cm Wt: 57.3 kg (~126#)  BMI: 20.1 IBW based on Hamwi Equation: 64.5 kg (+/- 10%)  (2/20) Anthropometrics: Ht: 168 cm Wt: 55 kg (121#)  BMI: 19.3 IBW based on Hamwi Equation: 64.5 kg (+/- 10%)   2/20 Wt: 121# (55 kg) 1/16 Wt: 112 # (50.9 kg) 11/30/19 Wt: 118 # (53.6 kg) 10/31/19 Wt: 123 # (55.4 kg) 08/09/18 Wt: 117# (53.2 kg) 11/13/16 Wt: 122 # (55.4 kg) 11/19/15 Wt: 121 # (55 kg)  Estimated minimum caloric needs: 2076 kcal/kg/day (EER)  Estimated minimum protein needs: 0.8 g/kg/day  Estimated minimum fluid needs: 1400-2000 mL/kg/day (25-35 mL/kg)   Primary concerns today: Follow-up given pt with ASD and picky eating. AFL accompanied pt to appt today. Appt in conjunction with Cathi Roan, SLP.  Dietary Intake Hx: DME: [facility] Usual eating pattern includes: 2-3 meals and 1 snacks per day. Typically skips lunch, but will have a snack instead. Meal duration: 5 minutes - 1 hour  Feeding skills: fed by caretaker  Chewing/swallowing difficulties with foods or liquids: drooling with liquids and occasionally coughing  Texture modifications: chopped    24-hr recall:  Breakfast: 2 eggs + grits + 4 pieces of bacon + 1 biscuit + 1 banana + ensure OR oatmeal w/ brown sugar and butter + + banana + ensure  Snack: none Lunch: 1 pb + jelly sandwich OR chicken salad sandwich Snack: none Dinner: chicken + rice + mixed vegetables + 2.5 biscuits + 2 glasses of juice  Snack: none    Typical  Beverages: milk (1%)/almond milk/juice/ water (2-3 cups/day)  Nutrition Supplements: 1 Ensure/Boost  Notes: Caregiver notes that they tried to give Tad thickened liquids, however he would not drink the liquid thickened. She feels his coughing with liquids has slightly improved since last visit. Zadiel continues to pocket food, however is not interested in taking a sip in between bites. Caregiver notes overall improvement in appetite including that Demarkis enjoys boost supplements and has been picking out pictures of foods he would like to have for his meals. Caregiver reports concern for slightly elevated cholesterol lab, but notes familial history.   Current Therapies: none  Physical Activity: delayed  GI: every 2-3 days (juice and Miralax daily)  GU: 2-3x/day (medium color urine)    Estimated intake likely meeting needs given improvement in weight gain. Pt consuming various food groups.  Pt likely consuming inadequate amounts of fluids, dairy.  Nutrition Diagnosis: (1/16) Swallowing difficulties related to dysphagia as evidenced by reported coughing and choking with liquids and frequently pocketing food.  Intervention: Discussed pt's weight and current intake. Praised caregiver on Chen's weight gain and overall increased appetite. Caregiver requested discontinuation of duocal given Demba's preference for Boost, RD will discontinue with [facility]. Discussed recommendations below. All questions answered, family in agreement with plan.   Nutrition and SLP Recommendations: - Continue 1 Ensure/Boost per day. I will discontinue duocal. Rishik's weight looks great!  - For Eddi's cholesterol - try having more fruits, vegetable, whole grains and low-fat dairy.  - Continue to try to alterate bites and sips to help  with Clovis Riley pocketing foods.  - Great job with having him make choices with what he's eating.   Handouts Given at Previous Appointments: - High Calorie, High  Protein Foods List  Teach back method used.  Monitoring/Evaluation: Goals to Monitor: - Weight trends - Dietary intake  - Hydration status  Follow-up with feeding team January 17th @ 9:30 AM @ [facility].  Total time spent in counseling: 23 minutes.

## 2022-01-29 ENCOUNTER — Ambulatory Visit (INDEPENDENT_AMBULATORY_CARE_PROVIDER_SITE_OTHER): Payer: Medicaid Other | Admitting: Speech Pathology

## 2022-01-29 ENCOUNTER — Ambulatory Visit (INDEPENDENT_AMBULATORY_CARE_PROVIDER_SITE_OTHER): Payer: Medicaid Other | Admitting: Dietician

## 2022-01-29 DIAGNOSIS — F840 Autistic disorder: Secondary | ICD-10-CM | POA: Diagnosis not present

## 2022-01-29 DIAGNOSIS — R1310 Dysphagia, unspecified: Secondary | ICD-10-CM | POA: Diagnosis not present

## 2022-01-29 DIAGNOSIS — R633 Feeding difficulties, unspecified: Secondary | ICD-10-CM

## 2022-01-29 DIAGNOSIS — R1312 Dysphagia, oropharyngeal phase: Secondary | ICD-10-CM

## 2022-01-29 NOTE — Patient Instructions (Addendum)
Nutrition and SLP Recommendations: - Continue 1 Ensure/Boost per day. I will discontinue duocal. Roberto Ramirez's weight looks great!  - For Roberto Ramirez's cholesterol - try having more fruits, vegetable, whole grains and low-fat dairy.  - Continue to try to alterate bites and sips to help with Roberto Ramirez pocketing foods.  - Great job with having him make choices with what he's eating.   Next appointment with feeding team will be Wednesday, January 17th @ 9:30 AM ([address], [place], [place])

## 2022-01-29 NOTE — Progress Notes (Signed)
SLP Feeding Evaluation Patient Details Name: QUANTAE MARTEL MRN: [id number] DOB: Mar 11, 1986 Today's Date: 01/29/2022   Visit Information:  Reason for referral: Feeding difficulties, ASD, dysphagia Referring provider: Dr. Moody Bruins  Overseeing provider: Dr. Moody Bruins - Feeding Clinic Pertinent medical hx: Epilepsy, ASD, anxiety , urinary/fecal incontinence Visit in conjunction Doreatha Massed, RD  General Observations: Omere was seen with family caregiver during this visit.  Feeding concerns currently: Caregiver reports Hersey has increased amount he is eating and drinking. She is pleased with his weight gain since last appt. Reports Jarmon did not accept any thickened liquids and will demonstrate refusal behaviors c/b turning head, stop hand signs or swatting at cups. Caregiver reports he may pocket or hold foods in his mouth, though she does try to pace him during meals to allow for oral clearance. Some coughing and choking during PO consumption, but not occurring frequently.   Schedule consists of:  24-hr recall:  Breakfast: 2 eggs + grits + 4 pieces of bacon + 1 biscuit + 1 banana + ensure OR oatmeal w/ brown sugar and butter + + banana + ensure  Snack: none Lunch: 1 pb + jelly sandwich OR chicken salad sandwich Snack: none Dinner: chicken + rice + mixed vegetables + 2.5 biscuits + 2 glasses of juice  Snack: none    Typical Beverages: milk (1%)/almond milk/juice/ water (2-3 cups/day)  Nutrition Supplements: 1 Ensure/Boost Caregiver reports he prefers to sit in his room on the bed for meals and typically consumes larger volume PO there.  Clinical Impressions: Pt continues to present with oropharyngeal dysphagia. Discussed continuing to alternate bites and sips to aid in overall oral clearance and efficiency during meal times. SLP praised caregiver for allowing Dorris to make his own choices for PO intake via visuals. This is a great way to encourage Worth to want to  participate and aid in building in a positive mealtime experience. If noted with ongoing coughing/choking, potentially may consider referring for a MBS to assess integrity of swallow function. Though per caregiver report, this is not occurring as frequently. Caregiver will continue to monitor in between now and next appt.    Nutrition and SLP Recommendations: - Continue 1 Ensure/Boost per day. I will discontinue duocal. Gryphon's weight looks great!  - For Even's cholesterol - try having more fruits, vegetable, whole grains and low-fat dairy.  - Continue to try to alterate bites and sips to help with Clovis Riley pocketing foods.  - Great job with having him make choices with what he's eating.                  Maudry Mayhew., M.A. CCC-SLP  01/29/2022, 10:09 AM

## 2022-02-05 NOTE — Progress Notes (Signed)
Patient: Roberto Ramirez MRN: [id number] Sex: male DOB: 05-Dec-1985  Provider: Lorenz Coaster, MD Location of Care: [facility]  Note type: New Patient  History of Present Illness: Referral Source:Roberto Hickling, MD  History from: patient and prior records Chief Complaint: autism and related behaviors   Roberto Ramirez is Ramirez 36 y.o. male with history of autism, severe anxiety, and seizures who I am seeing by the request of Roberto. Moody Ramirez for continued care on concern of worsening behavioral concerns, particularly with food. Review of prior history shows patient was last seen by Roberto. Moody Ramirez on 05/22/21 where she continued carbamazepine and clonidine which was previously prescribed by Roberto. Sharene Ramirez. Since then he has continued to follow up with the feeding team.   Patient presents today with his caregiver.  She reports that she thinks he had Ramirez seizure Ramirez few weeks ago. Was rocking side to side making noises, lasting 2-3 min. Notes his anxiety was high at the time and when she removed the stimulus and they stopped.   Ramirez few months ago, they ran out of his medication for Ramirez few days. Had Ramirez lage seizures, he went stiff, lips turned blue. Not sure how long it lasted. Post-ictal state of repeating "hi" to everyone, seemed out of it. Reported to Roberto Ramirez.   Concerned that since he had Covid-19 (in 2020). Seems more difficult to sit in Ramirez chair or even on the toilet. Has started urinating on himself, transitioned back to pull ups. Walks shorted distances than he used to. Will sit down when tired. He will also refuse to stand up even when being lifted. She feels that this has continued to get worse.   Has been losing weight, even since the last visit on 7/12 he has lost 3 lbs. However, thinks this may due to possible UTI. Not eating as much PO. Has started the antibiotics for the UTI 3 days ago. Caregiver reports that when he is in pain she will give tylenol.    Has been stooling 1-2 times Ramirez week, they can be very dry and small. He has been starting Miralax.   Sleep: Falls asleep well, stays asleep 9pm-6am  Anxiety: He started having anxiety around 2021. Continues to take Zoloft, which Roberto. Elyn Ramirez increased around this time. Has been on Klonopin since middle school. However, besides this, he has continued to have anxiety around things like the pool. In these moments will want to grab on to things. Has stopped taking lexapro.   Care Management: Went to Roberto Ramirez but couldn't see him because mother wouldn't come.   Mom has guardianship, doesn't come see him.  AFL is caregiver for him 24/7, since 2007.    Equipment Needs: Has gotten him Ramirez wheelchair, however even with this it can be difficult to transport him as he does not like to sit in it.  Diagnostics:  MRI brain with and without contrast 11/22/2019: 1. No acute intracranial abnormality. 2. Subtle asymmetric volume loss of the left hippocampal formation.Background age advanced but otherwise generalized cerebral volume loss. 3. But no focal encephalomalacia, abnormal signal or enhancement identified in the brain.  Past Medical History Past Medical History:  Diagnosis Date   Abnormal gait    Anxiety    Autism spectrum disorder requiring very substantial support (level 3)        Constipation    Depression    Non-verbal learning disorder    Seasonal allergies    Seizures (HCC)  last seizure yrs ago in high school, none as adult controlled with meds   Uses wheelchair    as needed when pt goes shoppjng. medical appts, trips,  Patient can walk but abn gait.    Surgical History Past Surgical History:  Procedure Laterality Date   CIRCUMCISION  1989   RADIOLOGY WITH ANESTHESIA N/Ramirez 11/22/2019   Procedure: MRI WITH ANESTHESIA BRAIN WITH AND WIHTOUT;  Surgeon: Radiologist, Medication, MD;  Location: [facility] OR;  Service: Radiology;  Laterality: N/Ramirez;    Family History family history includes Cancer in  his maternal grandmother; Lung cancer in his paternal grandfather.   Social History Social History   Social History Narrative   Roberto Ramirez is Ramirez 36 yo male.   He attends [employer] in [place].   He lives with his parents. He has two siblings.    Allergies No Known Allergies  Medications Current Outpatient Medications on File Prior to Visit  Medication Sig Dispense Refill   Calcium Citrate-Vitamin D (CALCIUM CITRATE + D PO) Take 2 tablets by mouth daily.     cetirizine (ZYRTEC) 10 MG tablet Take 10 mg by mouth daily.     clonazePAM (KLONOPIN) 1 MG tablet Take 1 mg by mouth 3 (three) times daily.     cloNIDine (CATAPRES) 0.1 MG tablet TAKE ONE HALF (1/2) TABLET BY MOUTH 3 TIMES Ramirez DAY AND ONE TABLET EVERY EVENINGAT 9PM. (ROUND YELLOW TABLET) 93 tablet 0   fluticasone (FLONASE) 50 MCG/ACT nasal spray Place 2 sprays into both nostrils daily.     ketoconazole (NIZORAL) 2 % cream Apply 1 application topically 2 (two) times daily.     lactose free nutrition (BOOST) LIQD Take 237 mLs by mouth daily.     linaCLOtide (LINZESS PO) Take by mouth daily.     mirtazapine (REMERON) 15 MG tablet Take 15 mg by mouth daily.     polyethylene glycol (MIRALAX / GLYCOLAX) packet Take 17 g by mouth daily at 6 PM.      sertraline (ZOLOFT) 100 MG tablet Take 150 mg by mouth daily.     Multiple Vitamin (MULTIVITAMIN) tablet Take 1 tablet by mouth daily. (Patient not taking: Reported on 05/22/2021)     naproxen sodium (ALEVE) 220 MG tablet Take 220 mg by mouth daily as needed (pain). (Patient not taking: Reported on 05/22/2021)     pseudoephedrine (SUDAFED) 120 MG 12 hr tablet Take 120 mg by mouth every 12 (twelve) hours as needed for congestion. (Patient not taking: Reported on 05/22/2021)     No current facility-administered medications on file prior to visit.   The medication list was reviewed and reconciled. All changes or newly prescribed medications were explained.  Ramirez complete medication list was provided  to the patient/caregiver.  Physical Exam BP 112/78   Pulse 80   Wt 123 lb (55.8 kg)   BMI 19.77 kg/m  Facility age limit for growth %iles is 20 years.  No results found. Gen: well appearing child Skin: No rash, No neurocutaneous stigmata. HEENT: Normocephalic, no dysmorphic features, no conjunctival injection, nares patent, mucous membranes moist, oropharynx clear. Neck: Supple, no meningismus. No focal tenderness. Resp: Clear to auscultation bilaterally CV: Regular rate, normal S1/S2, no murmurs, no rubs Abd: BS present, abdomen soft, non-tender, non-distended. No hepatosplenomegaly or mass Ext: Warm and well-perfused. No deformities, no muscle wasting, ROM full.  Neurological Examination: MS: Awake, alert, interactive. Poor eye contact, nonverbal, but looks to guardian for assistance.  Poor attention in room, mostly plays by himself. Cranial  Nerves: Pupils were equal and reactive to light;  EOM normal, no nystagmus; no ptsosis, no double vision, intact facial sensation, face symmetric with full strength of facial muscles, hearing intact grossly.  Motor-Low tone noted based on flexibility, however unable to assess directly. Normal strength in all muscle groups. No abnormal movements Reflexes- unable to assess Sensation: Withdraws to touch in all extremities.  Coordination: No dysmetria with reaching for objects    Diagnosis:  Problem List Items Addressed This Visit       Nervous and Auditory   Partial epilepsy with impairment of consciousness (HCC)   Relevant Medications   clonazePAM (KLONOPIN) 1 MG tablet   carbamazepine (CARBATROL) 300 MG 12 hr capsule     Other   Autism spectrum disorder with accompanying language impairment and intellectual disability, requiring very substantial support - Primary   Anxiety state   Relevant Medications   mirtazapine (REMERON) 15 MG tablet   Urinary incontinence   Fecal incontinence   Other Visit Diagnoses     Progressive locomotor  ataxia       Relevant Orders   MR CERVICAL SPINE WO CONTRAST   MR THORACIC SPINE WO CONTRAST   MR LUMBAR SPINE WO CONTRAST       Assessment and Plan Roberto Ramirez is Ramirez 36 y.o. male with history of autism, severe anxiety, and seizures who presents for continued care. His refusal to walk is increasing as well as insistence of leaning forward or to the side showing possible spinal cord compression. In addition, he holds on to things and prefers small spaces suggesting possible ataxia. He also has Ramirez new urine and fecal incontinence and Ramirez new urinary retention and constipation which also suggests spinal cord involvement. To assess this previously x-rays were ordered but he did not tolerate them. Ordered an MRI with sedation to assess his spine. To address his increased fecal incontinence, recommended increase in frequency of Miralax as well as assessment from GI. While on medication it seems that his seizures have been well controlled. Other events that caregiver has witnessed seem likely to be related to anxiety. Plan to continue carbamazepine and recommend increase in anxiety medication. If addressed by increase in Zoloft this rules out seizure activity, however, if events continue will evaluate for possible seizure activity.   - Ordered MRI of spine with sedation  - Will talk with PCP about obtaining lab work while sedated  - Recommend increase in Miralax and possible referral to GI if constipation continues, will discuss with PCP - Continue carbamazepine  - Recommend increase in Zoloft, will discuss with PCP  - Provided information on [facility] and [employer] for developmental disabilities  I spent 70 minutes on day of service on this patient including review of chart, discussion with patient and family, discussion of screening results, coordination with other providers and management of orders and paperwork.     Return in about 2 months (around 04/13/2022).  I, Mayra Reel, scribed for  and in the presence of Roberto Coaster, MD at today's visit on 02/10/2022.  I, Roberto Coaster MD MPH, personally performed the services described in this documentation, as scribed by Mayra Reel in my presence on 02/10/2022 and it is accurate, complete, and reviewed by me.    Roberto Coaster MD MPH Neurology and Neurodevelopment [facility]  [address], [place], [place] [address] Phone: [phone] Fax: [phone]

## 2022-02-10 ENCOUNTER — Ambulatory Visit (INDEPENDENT_AMBULATORY_CARE_PROVIDER_SITE_OTHER): Payer: Medicaid Other | Admitting: Pediatrics

## 2022-02-10 ENCOUNTER — Encounter (INDEPENDENT_AMBULATORY_CARE_PROVIDER_SITE_OTHER): Payer: Self-pay | Admitting: Pediatrics

## 2022-02-10 VITALS — BP 112/78 | HR 80 | Wt 123.0 lb

## 2022-02-10 DIAGNOSIS — R32 Unspecified urinary incontinence: Secondary | ICD-10-CM | POA: Diagnosis not present

## 2022-02-10 DIAGNOSIS — A5211 Tabes dorsalis: Secondary | ICD-10-CM

## 2022-02-10 DIAGNOSIS — F411 Generalized anxiety disorder: Secondary | ICD-10-CM

## 2022-02-10 DIAGNOSIS — F840 Autistic disorder: Secondary | ICD-10-CM | POA: Diagnosis not present

## 2022-02-10 DIAGNOSIS — G40209 Localization-related (focal) (partial) symptomatic epilepsy and epileptic syndromes with complex partial seizures, not intractable, without status epilepticus: Secondary | ICD-10-CM

## 2022-02-10 DIAGNOSIS — R159 Full incontinence of feces: Secondary | ICD-10-CM

## 2022-02-10 MED ORDER — CARBAMAZEPINE ER 300 MG PO CP12: ORAL_CAPSULE | ORAL | 2 refills | Status: DC

## 2022-02-10 NOTE — Patient Instructions (Addendum)
I will change his carbamazepine prescription to a 90 day supply but continue him on the same dose.  I will speak with Ms. York, NP to increase his Zoloft to 2 tablets a day for his anxiety.  I will also talk with Ms. York about getting you to increase his Miralax.  Depending on her preference, I would consider referral to adult GI doctor to work on his constipation.  I will also talk with her about getting labwork for him.  We may be able to do this while he is sedated for the imaging.   I would like to have an MRI of his spine to make sure there is not a physical reason for him to be walk less. This will need to be in the hospital so he can be sedated. You should be called to schedule this For autism services, consider services with [facility] or [employer] for developmental disabilities [email or web address]

## 2022-02-16 ENCOUNTER — Encounter (INDEPENDENT_AMBULATORY_CARE_PROVIDER_SITE_OTHER): Payer: Self-pay | Admitting: Pediatrics

## 2022-02-18 ENCOUNTER — Telehealth (INDEPENDENT_AMBULATORY_CARE_PROVIDER_SITE_OTHER): Payer: Self-pay | Admitting: Dietician

## 2022-02-18 NOTE — Telephone Encounter (Signed)
Who's calling (name and relationship to patient) : Ardeth Perfect; care provider   Best contact number: [phone]  Provider they see: Delorise Shiner, RD Reason for call: Amy was calling in wanting to speak to the nutritionist.   Call ID:      PRESCRIPTION REFILL ONLY  Name of prescription:  Pharmacy:

## 2022-02-19 ENCOUNTER — Telehealth (INDEPENDENT_AMBULATORY_CARE_PROVIDER_SITE_OTHER): Payer: Self-pay | Admitting: Dietician

## 2022-02-19 NOTE — Telephone Encounter (Signed)
  Name of who is calling: Amy Young  Caller's Relationship to Patient: Care Provider  Best contact number: [phone]  Provider they see: Sherryll Burger  Reason for call: Care provider is Requesting a callback anytime after 2:30PM.      PRESCRIPTION REFILL ONLY  Name of prescription:  Pharmacy:  \

## 2022-02-19 NOTE — Telephone Encounter (Signed)
Spoke with caregiver, Amy Young who mentioned Roberto Ramirez has been having constipation for past 2 weeks which she feels may be due to boost. Amy is interested in trying [employer] Standard 1.0 instead. RD will update orders to send to [facility].

## 2022-02-20 ENCOUNTER — Telehealth (INDEPENDENT_AMBULATORY_CARE_PROVIDER_SITE_OTHER): Payer: Self-pay | Admitting: Pediatrics

## 2022-02-20 ENCOUNTER — Other Ambulatory Visit (INDEPENDENT_AMBULATORY_CARE_PROVIDER_SITE_OTHER): Payer: Self-pay | Admitting: Dietician

## 2022-02-20 ENCOUNTER — Encounter (INDEPENDENT_AMBULATORY_CARE_PROVIDER_SITE_OTHER): Payer: Self-pay | Admitting: Dietician

## 2022-02-20 DIAGNOSIS — R638 Other symptoms and signs concerning food and fluid intake: Secondary | ICD-10-CM

## 2022-02-20 DIAGNOSIS — R633 Feeding difficulties, unspecified: Secondary | ICD-10-CM

## 2022-02-20 DIAGNOSIS — R1312 Dysphagia, oropharyngeal phase: Secondary | ICD-10-CM

## 2022-02-20 MED ORDER — NUTRITIONAL SUPPLEMENT PLUS PO LIQD: ORAL | 12 refills | Status: DC

## 2022-02-20 NOTE — Progress Notes (Signed)
RD securely emailed updated orders for 1 [facility] Standard 1.0 and discontinuation orders for Boost to [email or web address].

## 2022-02-20 NOTE — Telephone Encounter (Signed)
  Name of who is calling: Archie Patten from [facility]  Best contact number: [phone]  Provider they see: Dr. Artis Flock  Reason for call: Tonya called stating Jayse has MRI's coming up August 31 and she needs a updated H&P. She said she can e-mail it or fax it. Her fax number is [phone].      PRESCRIPTION REFILL ONLY  Name of prescription:  Pharmacy:

## 2022-02-20 NOTE — Telephone Encounter (Addendum)
H & P form RN has is for Pediatric sedation. Call to Radiology to request form they need completed for sedation on an adult

## 2022-02-20 NOTE — Telephone Encounter (Signed)
Adult form obtained and placed in Dr. Blair Heys box

## 2022-02-20 NOTE — Progress Notes (Signed)
Updated orders for [facility] Standard 1.0 and discontinued orders for Boost per caregivers request.

## 2022-02-23 NOTE — Telephone Encounter (Signed)
Paperwork completed and placed in Ellie's box.   Lorenz Coaster MD MPH

## 2022-02-24 NOTE — Telephone Encounter (Signed)
Form faxed to number in phone message

## 2022-03-13 ENCOUNTER — Encounter: Payer: Self-pay | Admitting: Pediatrics

## 2022-03-13 ENCOUNTER — Other Ambulatory Visit: Payer: Self-pay | Admitting: Pediatrics

## 2022-03-13 ENCOUNTER — Telehealth: Payer: Self-pay | Admitting: Pediatrics

## 2022-03-13 DIAGNOSIS — Z5181 Encounter for therapeutic drug level monitoring: Secondary | ICD-10-CM

## 2022-03-13 NOTE — Telephone Encounter (Signed)
H&P completed for MRI brain and spine.  Please fax to [phone] attn Tonya Delfino.   Please note that labwork has been ordered for patient while under sedation.   Lorenz Coaster MD MPH

## 2022-03-13 NOTE — Progress Notes (Signed)
Previous labwork received from [facility].   10/2021 CMP, TSH, CBC normal 10/2021.  09/2021 CMP, CBC normal Lipid panel mildly elevated, Vitamin D mildly low, B12 normal  Further labwork reviewed back to 2015 with no significant abnormalities including normal Folate, CK 2021  Notes received from [facility], patient allows labs in office without any complications.    Paperwork sent to scan.    New labs ordered for carbamezapine monitoring while sedated.   Lorenz Coaster MD MPH

## 2022-03-14 NOTE — Telephone Encounter (Signed)
Form faxed to Georgiana Spinner. Confirmation Received.

## 2022-03-19 ENCOUNTER — Other Ambulatory Visit: Payer: Self-pay

## 2022-03-19 ENCOUNTER — Encounter (HOSPITAL_COMMUNITY): Payer: Self-pay

## 2022-03-19 NOTE — Pre-Procedure Instructions (Signed)
    Roberto Ramirez  03/19/2022      [facility] [address] - [place], [place] - [address] [place] [address] Phone: [phone] Fax: [phone]    Your procedure is scheduled on 03/20/2022.  Report to [facility] Admitting at 0730 A.M.  Call this number if you have problems the morning of surgery:  [phone]   Remember:  Do not eat or drink after midnight.     Take these medicines the morning of surgery with A SIP OF WATER Carbamazepine Cetirizine cloNIDine  clonazePAM fluticasone  pseudoephedrine  sertraline     Do not wear jewelry, make-up or nail polish.  Do not wear lotions, powders, or perfumes, or deodorant.  Do not shave 48 hours prior to surgery.  Men may shave face and neck.  Do not bring valuables to the hospital.  [facility] is not responsible for any belongings or valuables.  Contacts, dentures or bridgework may not be worn into surgery.  Leave your suitcase in the car.  After surgery it may be brought to your room.  For patients admitted to the hospital, discharge time will be determined by your treatment team.  Patients discharged the day of surgery will not be allowed to drive home.   Name and phone number of your driver:   Ardeth Perfect (Other)  [phone]

## 2022-03-20 ENCOUNTER — Ambulatory Visit (HOSPITAL_COMMUNITY)
Admission: RE | Admit: 2022-03-20 | Discharge: 2022-03-20 | Disposition: A | Discharge status: Home / Self Care | Payer: Medicaid Other | Source: Ambulatory Visit | Attending: Pediatrics | Admitting: Pediatrics

## 2022-03-20 ENCOUNTER — Ambulatory Visit (HOSPITAL_COMMUNITY): Payer: Medicaid Other | Admitting: Anesthesiology

## 2022-03-20 ENCOUNTER — Encounter (HOSPITAL_COMMUNITY): Admission: RE | Disposition: A | Discharge status: Home / Self Care | Payer: Self-pay | Source: Home / Self Care

## 2022-03-20 ENCOUNTER — Encounter (HOSPITAL_COMMUNITY): Payer: Self-pay

## 2022-03-20 ENCOUNTER — Ambulatory Visit (HOSPITAL_COMMUNITY)
Admission: RE | Admit: 2022-03-20 | Discharge: 2022-03-20 | Disposition: A | Discharge status: Home / Self Care | Payer: Medicaid Other | Attending: Emergency Medicine | Admitting: Emergency Medicine

## 2022-03-20 ENCOUNTER — Ambulatory Visit (HOSPITAL_BASED_OUTPATIENT_CLINIC_OR_DEPARTMENT_OTHER): Payer: Medicaid Other | Admitting: Anesthesiology

## 2022-03-20 DIAGNOSIS — M47812 Spondylosis without myelopathy or radiculopathy, cervical region: Secondary | ICD-10-CM | POA: Diagnosis not present

## 2022-03-20 DIAGNOSIS — A5211 Tabes dorsalis: Secondary | ICD-10-CM

## 2022-03-20 DIAGNOSIS — M4802 Spinal stenosis, cervical region: Secondary | ICD-10-CM | POA: Diagnosis not present

## 2022-03-20 DIAGNOSIS — F840 Autistic disorder: Secondary | ICD-10-CM | POA: Insufficient documentation

## 2022-03-20 DIAGNOSIS — G40909 Epilepsy, unspecified, not intractable, without status epilepticus: Secondary | ICD-10-CM | POA: Insufficient documentation

## 2022-03-20 DIAGNOSIS — R270 Ataxia, unspecified: Secondary | ICD-10-CM | POA: Diagnosis present

## 2022-03-20 HISTORY — PX: RADIOLOGY WITH ANESTHESIA: SHX6223

## 2022-03-20 LAB — CBC WITH DIFFERENTIAL/PLATELET
Abs Immature Granulocytes: 0.03 10*3/uL (ref 0.00–0.07)
Basophils Absolute: 0 10*3/uL (ref 0.0–0.1)
Basophils Relative: 0 %
Eosinophils Absolute: 0.3 10*3/uL (ref 0.0–0.5)
Eosinophils Relative: 5 %
HCT: 49.7 % (ref 39.0–52.0)
Hemoglobin: 16.8 g/dL (ref 13.0–17.0)
Immature Granulocytes: 1 %
Lymphocytes Relative: 33 %
Lymphs Abs: 1.7 10*3/uL (ref 0.7–4.0)
MCH: 29.8 pg (ref 26.0–34.0)
MCHC: 33.8 g/dL (ref 30.0–36.0)
MCV: 88.1 fL (ref 80.0–100.0)
Monocytes Absolute: 0.5 10*3/uL (ref 0.1–1.0)
Monocytes Relative: 9 %
Neutro Abs: 2.8 10*3/uL (ref 1.7–7.7)
Neutrophils Relative %: 52 %
Platelets: 299 10*3/uL (ref 150–400)
RBC: 5.64 MIL/uL (ref 4.22–5.81)
RDW: 14.1 % (ref 11.5–15.5)
WBC: 5.3 10*3/uL (ref 4.0–10.5)
nRBC: 0 % (ref 0.0–0.2)

## 2022-03-20 LAB — COMPREHENSIVE METABOLIC PANEL
ALT: 23 U/L (ref 0–44)
AST: 21 U/L (ref 15–41)
Albumin: 4.6 g/dL (ref 3.5–5.0)
Alkaline Phosphatase: 94 U/L (ref 38–126)
Anion gap: 9 (ref 5–15)
BUN: 10 mg/dL (ref 6–20)
CO2: 25 mmol/L (ref 22–32)
Calcium: 9.2 mg/dL (ref 8.9–10.3)
Chloride: 104 mmol/L (ref 98–111)
Creatinine, Ser: 0.54 mg/dL — ABNORMAL LOW (ref 0.61–1.24)
GFR, Estimated: >60 mL/min (ref >60)
Glucose, Bld: 87 mg/dL (ref 70–99)
Potassium: 4.5 mmol/L (ref 3.5–5.1)
Sodium: 138 mmol/L (ref 135–145)
Total Bilirubin: 0.4 mg/dL (ref 0.3–1.2)
Total Protein: 7.4 g/dL (ref 6.5–8.1)

## 2022-03-20 LAB — VITAMIN B12: Vitamin B-12: 242 pg/mL (ref 180–914)

## 2022-03-20 LAB — IRON AND TIBC
Iron: 94 ug/dL (ref 45–182)
Saturation Ratios: 29 % (ref 17.9–39.5)
TIBC: 319 ug/dL (ref 250–450)
UIBC: 225 ug/dL

## 2022-03-20 LAB — FOLATE: Folate: 9.3 ng/mL (ref >5.9)

## 2022-03-20 LAB — FERRITIN: Ferritin: 38 ng/mL (ref 24–336)

## 2022-03-20 SURGERY — MRI WITH ANESTHESIA
Anesthesia: General

## 2022-03-20 MED ORDER — DEXAMETHASONE SODIUM PHOSPHATE 10 MG/ML IJ SOLN
INTRAMUSCULAR | Status: DC | PRN
  Administered 2022-03-20: 10 mg via INTRAVENOUS

## 2022-03-20 MED ORDER — CHLORHEXIDINE GLUCONATE 0.12 % MT SOLN
15.0000 mL | Freq: Once | OROMUCOSAL | Status: AC
  Administered 2022-03-20: 15 mL via OROMUCOSAL
  Filled 2022-03-20: qty 15

## 2022-03-20 MED ORDER — LACTATED RINGERS IV SOLN
INTRAVENOUS | Status: DC
Start: 2022-03-20 — End: 2022-03-20

## 2022-03-20 MED ORDER — ORAL CARE MOUTH RINSE: 15.0000 mL | Freq: Once | OROMUCOSAL | Status: AC

## 2022-03-20 MED ORDER — ROCURONIUM BROMIDE 10 MG/ML (PF) SYRINGE
PREFILLED_SYRINGE | INTRAVENOUS | Status: DC | PRN
  Administered 2022-03-20: 40 mg via INTRAVENOUS

## 2022-03-20 MED ORDER — DEXMEDETOMIDINE (PRECEDEX) IN NS 20 MCG/5ML (4 MCG/ML) IV SYRINGE
PREFILLED_SYRINGE | INTRAVENOUS | Status: DC | PRN
  Administered 2022-03-20 (×2): 8 ug via INTRAVENOUS
  Administered 2022-03-20 (×2): 4 ug via INTRAVENOUS

## 2022-03-20 MED ORDER — LIDOCAINE 2% (20 MG/ML) 5 ML SYRINGE
INTRAMUSCULAR | Status: DC | PRN
  Administered 2022-03-20: 50 mg via INTRAVENOUS

## 2022-03-20 MED ORDER — GLYCOPYRROLATE PF 0.2 MG/ML IJ SOSY
PREFILLED_SYRINGE | INTRAMUSCULAR | Status: DC | PRN
  Administered 2022-03-20: .2 mg via INTRAVENOUS

## 2022-03-20 MED ORDER — EPHEDRINE SULFATE-NACL 50-0.9 MG/10ML-% IV SOSY
PREFILLED_SYRINGE | INTRAVENOUS | Status: DC | PRN
  Administered 2022-03-20: 5 mg via INTRAVENOUS

## 2022-03-20 MED ORDER — FENTANYL CITRATE (PF) 250 MCG/5ML IJ SOLN
INTRAMUSCULAR | Status: DC | PRN
  Administered 2022-03-20 (×2): 50 ug via INTRAVENOUS

## 2022-03-20 MED ORDER — SUGAMMADEX SODIUM 200 MG/2ML IV SOLN
INTRAVENOUS | Status: DC | PRN
  Administered 2022-03-20 (×2): 100 mg via INTRAVENOUS
  Administered 2022-03-20: 50 mg via INTRAVENOUS

## 2022-03-20 MED ORDER — ONDANSETRON HCL 4 MG/2ML IJ SOLN
INTRAMUSCULAR | Status: DC | PRN
  Administered 2022-03-20: 4 mg via INTRAVENOUS

## 2022-03-20 MED ORDER — PHENYLEPHRINE 80 MCG/ML (10ML) SYRINGE FOR IV PUSH (FOR BLOOD PRESSURE SUPPORT)
PREFILLED_SYRINGE | INTRAVENOUS | Status: DC | PRN
  Administered 2022-03-20: 80 ug via INTRAVENOUS

## 2022-03-20 MED ORDER — PHENYLEPHRINE HCL-NACL 20-0.9 MG/250ML-% IV SOLN
INTRAVENOUS | Status: DC | PRN
  Administered 2022-03-20: 25 ug/min via INTRAVENOUS

## 2022-03-20 MED ORDER — PROPOFOL 10 MG/ML IV BOLUS
INTRAVENOUS | Status: DC | PRN
  Administered 2022-03-20: 20 mg via INTRAVENOUS
  Administered 2022-03-20: 30 mg via INTRAVENOUS
  Administered 2022-03-20: 120 mg via INTRAVENOUS

## 2022-03-20 MED ORDER — MIDAZOLAM HCL 2 MG/2ML IJ SOLN
INTRAMUSCULAR | Status: DC | PRN
  Administered 2022-03-20: 2 mg via INTRAVENOUS

## 2022-03-20 NOTE — Anesthesia Preprocedure Evaluation (Addendum)
Anesthesia Evaluation  Patient identified by MRN, date of birth, ID band Patient awake    Reviewed: Allergy & Precautions, NPO status , Patient's Chart, lab work & pertinent test results  History of Anesthesia Complications Negative for: history of anesthetic complications  Airway Mallampati: Unable to assess       Dental  (+) Dental Advisory Given, Teeth Intact   Pulmonary    breath sounds clear to auscultation       Cardiovascular negative cardio ROS   Rhythm:Regular     Neuro/Psych Seizures -, Well Controlled,  Autism spectrum disorder   Neuromuscular disease    GI/Hepatic negative GI ROS, Neg liver ROS,   Endo/Other  negative endocrine ROS  Renal/GU negative Renal ROS     Musculoskeletal negative musculoskeletal ROS (+)   Abdominal   Peds  Hematology negative hematology ROS (+)   Anesthesia Other Findings PROGRESSIVE LOCOMOTOR ATAXIA  Reproductive/Obstetrics                            Anesthesia Physical Anesthesia Plan  ASA: 3  Anesthesia Plan: General   Post-op Pain Management: Minimal or no pain anticipated   Induction: Intravenous  PONV Risk Score and Plan: 2 and Ondansetron and Dexamethasone  Airway Management Planned: Oral ETT  Additional Equipment: None  Intra-op Plan:   Post-operative Plan: Extubation in OR  Informed Consent: I have reviewed the patients History and Physical, chart, labs and discussed the procedure including the risks, benefits and alternatives for the proposed anesthesia with the patient or authorized representative who has indicated his/her understanding and acceptance.     Consent reviewed with POA and Dental advisory given  Plan Discussed with: CRNA  Anesthesia Plan Comments:        Anesthesia Quick Evaluation

## 2022-03-20 NOTE — Transfer of Care (Signed)
Immediate Anesthesia Transfer of Care Note  Patient: Roberto Ramirez  Procedure(s) Performed: MRI WITH ANESTHESIA-CERVICAL SPINE W/O CONTRAST- THORACIC SPINE W/O CONTRAST- LUMBAR SPINE W/O CONTRAST  Patient Location: PACU  Anesthesia Type:General  Level of Consciousness: awake and alert   Airway & Oxygen Therapy: Patient Spontanous Breathing  Post-op Assessment: Report given to RN and Post -op Vital signs reviewed and stable  Post vital signs: Reviewed and stable  Last Vitals:  Vitals Value Taken Time  BP 93/59 03/20/22 1400  Temp    Pulse 81 03/20/22 1411  Resp 16 03/20/22 1411  SpO2 100 % 03/20/22 1411  Vitals shown include unvalidated device data.  Last Pain:  Vitals:   03/20/22 0749  TempSrc: Oral         Complications: No notable events documented.

## 2022-03-20 NOTE — Anesthesia Procedure Notes (Signed)
Procedure Name: Intubation Date/Time: 03/20/2022 12:14 PM  Performed by: Bryson Corona, CRNAPre-anesthesia Checklist: Patient identified, Emergency Drugs available, Suction available and Patient being monitored Patient Re-evaluated:Patient Re-evaluated prior to induction Oxygen Delivery Method: Circle System Utilized Preoxygenation: Pre-oxygenation with 100% oxygen Induction Type: IV induction Ventilation: Mask ventilation without difficulty Laryngoscope Size: Mac and 4 Grade View: Grade I Tube type: Oral Number of attempts: 1 Airway Equipment and Method: Stylet and Oral airway Placement Confirmation: ETT inserted through vocal cords under direct vision, positive ETCO2 and breath sounds checked- equal and bilateral Secured at: 22 cm Tube secured with: Tape Dental Injury: Teeth and Oropharynx as per pre-operative assessment

## 2022-03-20 NOTE — Anesthesia Postprocedure Evaluation (Signed)
Anesthesia Post Note  Patient: Roberto Ramirez  Procedure(s) Performed: MRI WITH ANESTHESIA-CERVICAL SPINE W/O CONTRAST- THORACIC SPINE W/O CONTRAST- LUMBAR SPINE W/O CONTRAST     Patient location during evaluation: PACU Anesthesia Type: General Level of consciousness: sedated and patient cooperative Pain management: pain level controlled Vital Signs Assessment: post-procedure vital signs reviewed and stable Respiratory status: spontaneous breathing Cardiovascular status: stable Anesthetic complications: no   No notable events documented.  Last Vitals:  Vitals:   03/20/22 1400 03/20/22 1415  BP: (!) 93/59 (!) 97/55  Pulse: 79 81  Resp: 12 17  Temp: (!) 36.4 C 36.4 C  SpO2: 98% 100%    Last Pain:  Vitals:   03/20/22 0749  TempSrc: Oral                 Lewie Loron

## 2022-03-21 ENCOUNTER — Encounter (HOSPITAL_COMMUNITY): Payer: Self-pay | Admitting: Radiology

## 2022-03-23 LAB — MISC LABCORP TEST (SEND OUT): Labcorp test code: 123220

## 2022-03-23 LAB — VITAMIN B6: Vitamin B6: 10.2 ug/L (ref 3.4–65.2)

## 2022-04-25 NOTE — Progress Notes (Signed)
Patient: Roberto Ramirez MRN: [id number] Sex: male DOB: 05-Dec-1985  Provider: Carylon Perches, MD Location of Care: [facility]  Note type: Routine follow-up  History of Present Illness:  Roberto Ramirez is a 36 y.o. male with history of autism, severe anxiety, and seizures who I am seeing for routine follow-up. Patient was last seen on 02/10/22 where I ordered an MRI of the spine and continued carbamazepine with plans to obtain levels for this while sedated.  Since the last appointment, he had the MRI of his spine on 03/20/22, which showed no acute abnormality.   Patient presents today with his caregiver. Discussed the results of his spine MRI today. Explained that his spinal cord is intact however he does have a shape abnormality in his S1.   Mom wonders if this pain is the reason he does not like to walk and why he does not want to sit on the toilet.      She also reports that she has stopped giving him Boost because it was causing some constipation. But he still does have some constipation. They were going to try the [facility] but mom reports she never received them. She does note she reports that he does not drink very much water at his day program.    Diagnostics:  MRI of the spine 03/20/22 Impression: 1. No acute abnormality.  Normal spinal cord. 2. 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 3. 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 Trace anterolisthesis at S1-S2 due to chronic S1 pars defects  MRI brain with and without contrast 11/22/2019: 1. No acute intracranial abnormality. 2. Subtle asymmetric volume loss of the left hippocampal formation.Background age advanced but otherwise generalized cerebral volume loss. 3. But no focal encephalomalacia, abnormal signal or enhancement identified in the brain.   Past Medical History Past Medical History:  Diagnosis Date   Abnormal gait     Anxiety    Autism spectrum disorder requiring very substantial support (level 3)        Constipation    Depression    Non-verbal learning disorder    Seasonal allergies    Seizures ([place])    last seizure yrs ago in high school, none as adult controlled with meds   Uses wheelchair    as needed when pt goes shoppjng. medical appts, trips,  Patient can walk but abn gait.    Surgical History Past Surgical History:  Procedure Laterality Date   CIRCUMCISION  1989   RADIOLOGY WITH ANESTHESIA N/A 11/22/2019   Procedure: MRI WITH ANESTHESIA BRAIN WITH AND WIHTOUT;  Surgeon: Radiologist, Medication, MD;  Location: [place];  Service: Radiology;  Laterality: N/A;   RADIOLOGY WITH ANESTHESIA N/A 03/20/2022   Procedure: MRI WITH ANESTHESIA-CERVICAL SPINE W/O CONTRAST- THORACIC SPINE W/O CONTRAST- LUMBAR SPINE W/O CONTRAST;  Surgeon: Radiologist, Medication, MD;  Location: [place];  Service: Radiology;  Laterality: N/A;    Family History family history includes Cancer in his maternal grandmother; Lung cancer in his paternal grandfather.   Social History Social History   Social History Narrative   Makaio is a 36 y.o. male.   He attends [employer] in [place].   He lives with his parents. He has two siblings.    Allergies No Known Allergies  Medications Current Outpatient Medications on File Prior to Visit  Medication Sig Dispense Refill   Calcium Citrate-Vitamin D (CALCIUM CITRATE + D PO) Take 2 tablets by mouth daily.  cetirizine (ZYRTEC) 10 MG tablet Take 10 mg by mouth daily.     clonazePAM (KLONOPIN) 1 MG tablet Take 1 mg by mouth 3 (three) times daily.     fluticasone (FLONASE) 50 MCG/ACT nasal spray Place 2 sprays into both nostrils daily.     ketoconazole (NIZORAL) 2 % cream Apply 1 application  topically 2 (two) times daily as needed for irritation.     linaclotide (LINZESS) 145 MCG CAPS capsule Take 145 mcg by mouth daily.     naproxen sodium (ALEVE) 220 MG tablet Take 220 mg  by mouth daily as needed (pain).     polyethylene glycol (MIRALAX / GLYCOLAX) packet Take 17 g by mouth daily at 6 PM.      pseudoephedrine (SUDAFED) 120 MG 12 hr tablet Take 120 mg by mouth every 12 (twelve) hours as needed for congestion.     sertraline (ZOLOFT) 100 MG tablet Take 150 mg by mouth daily.     cephALEXin (KEFLEX) 500 MG capsule Take 500 mg by mouth 3 (three) times daily. Started 03/05/22, 14 days (Patient not taking: Reported on 05/01/2022)     No current facility-administered medications on file prior to visit.   The medication list was reviewed and reconciled. All changes or newly prescribed medications were explained.  A complete medication list was provided to the patient/caregiver.  Physical Exam BP 108/86 (BP Location: Left Arm, Patient Position: Sitting, Cuff Size: Normal)   Ht 5' 8.26" (1.734 m)   Wt 124 lb 12.8 oz (56.6 kg)   BMI 18.83 kg/m  Facility age limit for growth %iles is 20 years.  No results found. Gen: well appearing child Skin: No rash, No neurocutaneous stigmata. HEENT: Normocephalic, no dysmorphic features, no conjunctival injection, nares patent, mucous membranes moist, oropharynx clear. Neck: Supple, no meningismus. No focal tenderness. Resp: Clear to auscultation bilaterally CV: Regular rate, normal S1/S2, no murmurs, no rubs Abd: BS present, abdomen soft, non-tender, non-distended. No hepatosplenomegaly or mass Ext: Warm and well-perfused. No deformities, no muscle wasting, ROM full.  Neurological Examination: MS: Awake, alert, interactive. Poor eye contact, nonverbal.  Poor attention in room, mostly plays by himself.  Cranial Nerves: Pupils were equal and reactive to light;  EOM normal, no nystagmus; no ptsosis, no double vision, intact facial sensation, face symmetric with full strength of facial muscles, hearing intact grossly.  Motor-Normal tone throughout, Normal strength in all muscle groups. No abnormal movements. W sitting.  Reflexes-  Reflexes 2+ and symmetric in the biceps, triceps, patellar and achilles tendon. Plantar responses flexor bilaterally, no clonus noted Sensation: Intact to light touch throughout.   Coordination: No dysmetria with reaching for objects    Diagnosis: 1. Full incontinence of feces   2. Autism spectrum disorder with accompanying intellectual impairment, requiring substantial support (level 2)   3. Partial epilepsy with impairment of consciousness (HCC)   4. Constipation, unspecified constipation type   5. Spinal anomaly, congenital   6. Chronic midline low back pain without sciatica      Assessment and Plan Roberto Ramirez is a 36 y.o. male with history of autism, severe anxiety, and seizures who I am seeing in follow-up. Discussed the results of MRI of the spine with caregiver today. I am glad that there is no injury to the spinal cord, and explained that he may have difficulty sitting and standing due to bone pain related to deformity in C1. To address this I will start Naproxen for pain. In addition showed mom options for a  toilet seat to help with comfort, will refer to PT to evaluate for best options for this. I explained that his discomfort may be worsened by constipation as well, recomded evalutiaon from GI to address his constipation and offer more medication options for management. Offered referral to spine specialist for further recommendations, however, caregiver would like to wait at this time. He has not been receiving Molli Posey from [facility]. Agreed to reach out to them to request they restart the order. In addition, discussed his anxiety with caregiver today, recommend that she talk with his PCP about increasing his Zoloft to improve this.   - Start Naproxen 500 mg PRN for pain  - Continued Rimeron, Carbatrol, and clonidine today  - Referred to PT for eval for toilet seat to improve pain related to positioning  - Referred to adult GI  - Recommend increasing Zoloft for anxiety  -  Agreed to reach out to [facility] bout formula supplies   I spent 40 minutes on day of service on this patient including review of chart, discussion with patient and family, discussion of screening results, coordination with other providers and management of orders and paperwork.     Return in about 3 months (around 08/01/2022).  I, Mayra Reel, scribed for and in the presence of Lorenz Coaster, MD at today's visit on 05/01/2022.   I, Lorenz Coaster MD MPH, personally performed the services described in this documentation, as scribed by Mayra Reel in my presence on 05/01/2022 and it is accurate, complete, and reviewed by me.    Lorenz Coaster MD MPH Neurology and Neurodevelopment [facility]  [address] [place], [place], [place] [address] Phone: [phone] Fax: [phone]

## 2022-05-01 ENCOUNTER — Ambulatory Visit (INDEPENDENT_AMBULATORY_CARE_PROVIDER_SITE_OTHER): Payer: Medicaid Other | Admitting: Pediatrics

## 2022-05-01 ENCOUNTER — Encounter (INDEPENDENT_AMBULATORY_CARE_PROVIDER_SITE_OTHER): Payer: Self-pay | Admitting: Pediatrics

## 2022-05-01 VITALS — BP 108/86 | Ht 68.26 in | Wt 124.8 lb

## 2022-05-01 DIAGNOSIS — G40209 Localization-related (focal) (partial) symptomatic epilepsy and epileptic syndromes with complex partial seizures, not intractable, without status epilepticus: Secondary | ICD-10-CM | POA: Diagnosis not present

## 2022-05-01 DIAGNOSIS — Q7649 Other congenital malformations of spine, not associated with scoliosis: Secondary | ICD-10-CM

## 2022-05-01 DIAGNOSIS — F840 Autistic disorder: Secondary | ICD-10-CM | POA: Diagnosis not present

## 2022-05-01 DIAGNOSIS — M545 Low back pain, unspecified: Secondary | ICD-10-CM

## 2022-05-01 DIAGNOSIS — K5900 Constipation, unspecified: Secondary | ICD-10-CM

## 2022-05-01 DIAGNOSIS — R159 Full incontinence of feces: Secondary | ICD-10-CM

## 2022-05-01 MED ORDER — MIRTAZAPINE 15 MG PO TABS: 15.0000 mg | ORAL_TABLET | Freq: Every day | ORAL | 5 refills | Status: DC

## 2022-05-01 MED ORDER — CLONIDINE HCL 0.1 MG PO TABS: ORAL_TABLET | ORAL | 0 refills | Status: DC

## 2022-05-01 MED ORDER — NAPROXEN 500 MG PO TABS: 500.0000 mg | ORAL_TABLET | Freq: Every morning | ORAL | 3 refills | Status: DC

## 2022-05-01 MED ORDER — CARBAMAZEPINE ER 300 MG PO CP12: ORAL_CAPSULE | ORAL | 2 refills | Status: DC

## 2022-05-01 NOTE — Patient Instructions (Addendum)
Start giving him Naproxen every morning to help with pain. Placed a prescription for this today.  We will reach out to [facility] about getting him on [place]. Phone: [phone]  I placed  a referral for GI here in [place]. Address: [place] [address], [place], [place] [address] Phone: [phone] Talk about increasing his Zoloft with Dr. Janalyn Harder. I also placed a referral for neuro rehabilitation PT Address: [address], [place], [place],  [place]  [address] Phone: [phone] .

## 2022-05-11 ENCOUNTER — Encounter (INDEPENDENT_AMBULATORY_CARE_PROVIDER_SITE_OTHER): Payer: Self-pay | Admitting: Pediatrics

## 2022-05-23 ENCOUNTER — Ambulatory Visit: Payer: Medicaid Other | Attending: Pediatrics | Admitting: Physical Therapy

## 2022-05-23 ENCOUNTER — Encounter: Payer: Self-pay | Admitting: Physical Therapy

## 2022-05-23 ENCOUNTER — Other Ambulatory Visit: Payer: Self-pay

## 2022-05-23 VITALS — BP 129/85 | HR 93

## 2022-05-23 DIAGNOSIS — R2681 Unsteadiness on feet: Secondary | ICD-10-CM | POA: Diagnosis present

## 2022-05-23 DIAGNOSIS — Q7649 Other congenital malformations of spine, not associated with scoliosis: Secondary | ICD-10-CM | POA: Insufficient documentation

## 2022-05-23 DIAGNOSIS — M5459 Other low back pain: Secondary | ICD-10-CM

## 2022-05-23 DIAGNOSIS — G8929 Other chronic pain: Secondary | ICD-10-CM | POA: Diagnosis not present

## 2022-05-23 DIAGNOSIS — M6281 Muscle weakness (generalized): Secondary | ICD-10-CM

## 2022-05-23 DIAGNOSIS — R2689 Other abnormalities of gait and mobility: Secondary | ICD-10-CM

## 2022-05-23 DIAGNOSIS — M545 Low back pain, unspecified: Secondary | ICD-10-CM | POA: Insufficient documentation

## 2022-05-23 NOTE — Therapy (Signed)
OUTPATIENT PHYSICAL THERAPY THORACOLUMBAR EVALUATION   Patient Name: Roberto Ramirez MRN: [id number] DOB:[birth year], 36 y.o., male Today's Date: 05/23/2022   PT End of Session - 05/23/22 1032     Visit Number 1    Number of Visits 5    Date for PT Re-Evaluation 07/25/22   pushed due to PT limited availability and pt need to stay with same therapist each visit   Authorization Type MEDICAID [place] ACCESS    Authorization - Number of Visits 27    PT Start Time 1020   pt late   PT Stop Time 1102    PT Time Calculation (min) 42 min    Activity Tolerance Patient tolerated treatment well    Behavior During Therapy WFL for tasks assessed/performed             Past Medical History:  Diagnosis Date   Abnormal gait    Anxiety    Autism spectrum disorder requiring very substantial support (level 3)        Constipation    Depression    Non-verbal learning disorder    Seasonal allergies    Seizures (HCC)    last seizure yrs ago in high school, none as adult controlled with meds   Uses wheelchair    as needed when pt goes shoppjng. medical appts, trips,  Patient can walk but abn gait.   Past Surgical History:  Procedure Laterality Date   CIRCUMCISION  1989   RADIOLOGY WITH ANESTHESIA N/A 11/22/2019   Procedure: MRI WITH ANESTHESIA BRAIN WITH AND WIHTOUT;  Surgeon: Radiologist, Medication, MD;  Location: [facility] OR;  Service: Radiology;  Laterality: N/A;   RADIOLOGY WITH ANESTHESIA N/A 03/20/2022   Procedure: MRI WITH ANESTHESIA-CERVICAL SPINE W/O CONTRAST- THORACIC SPINE W/O CONTRAST- LUMBAR SPINE W/O CONTRAST;  Surgeon: Radiologist, Medication, MD;  Location: [facility] OR;  Service: Radiology;  Laterality: N/A;   Patient Active Problem List   Diagnosis Date Noted   Anorexia 11/30/2019   Fecal incontinence 10/31/2019   Urinary incontinence 11/13/2016   Autism spectrum disorder with accompanying language impairment and intellectual disability, requiring very substantial support 10/18/2014    Mixed receptive-expressive language disorder 10/18/2014   Anxiety state 10/18/2014   Partial epilepsy with impairment of consciousness (HCC) 07/26/2013   Abnormality of gait 07/26/2013   Encounter for long-term (current) use of other medications 07/26/2013    PCP: [facility] per caregiver  REFERRING PROVIDER: Margurite Auerbach, MD  ONSET DATE: 05/01/2022  REFERRING DIAG: 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  Mom reports it took him a year to recover, he had several issues walking and peeing.  He is much less active, has difficulty standing straight, and sits in this position (gestures to pt sitting in W sitting on floor in treatment room).  He leans when sitting on the toilet.  They state pt stays in 'W' sit for 90% of the time, even falling asleep in this position. Pt accompanied by: family member and caregiver Amy; mom Marcelino Duster  PERTINENT HISTORY: Incontinence of bowel and bladder, autism, severe anxiety, and seizures   PAIN:  Are you having pain? Unable to verbalize  PRECAUTIONS: Fall  WEIGHT BEARING RESTRICTIONS: No  FALLS: Has patient fallen in last 6 months? No  LIVING ENVIRONMENT: Lives with: lives with caregiver since 2007 Lives in: House/apartment Stairs: No Has following equipment at home: Wheelchair (manual) and shower chair  PLOF: Needs assistance with ADLs, Needs assistance with  homemaking, Needs assistance with gait, and Needs assistance with transfers  PATIENT GOALS: Per caregiver and mom:  tolerate standing more  OBJECTIVE:   DIAGNOSTIC FINDINGS:  MRI of the spine 03/20/22 Impression: 1. No acute abnormality.  Normal spinal cord. 2. 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 3. 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 Trace anterolisthesis at S1-S2 due to chronic S1 pars defects   MRI brain with and without contrast 11/22/2019: 1. No acute intracranial abnormality. 2. Subtle asymmetric volume loss of the left hippocampal formation.Background age advanced but otherwise generalized cerebral volume loss. 3. But no focal encephalomalacia, abnormal signal or enhancement identified in the brain.  PATIENT SURVEYS:  Pt unable to complete.  SCREENING FOR RED FLAGS: Bowel or bladder incontinence: Yes: MD aware, change since having COVID Spinal tumors: No Cauda equina syndrome: No Compression fracture: No Abdominal aneurysm: No  COGNITION: Overall cognitive status: Impaired     SENSATION: Light touch: Not tested-pt responds to touch, unable to formally assess  POSTURE: decreased lumbar lordosis, anterior pelvic tilt, and prefers to sit in W sitting on floor w/ RLE ER and LLE IR  PALPATION: Not tender to palpation  LUMBAR ROM:   AROM eval  Flexion WFL during functional assessment.  Extension   Right lateral flexion   Left lateral flexion   Right rotation   Left rotation    (Blank rows = not tested)  LOWER EXTREMITY ROM:     Active  Right eval Left eval  Hip flexion [facility]  Hip extension    Hip abduction    Hip adduction    Hip internal rotation    Hip external rotation    Knee flexion    Knee extension " "  Ankle dorsiflexion    Ankle plantarflexion    Ankle inversion    Ankle eversion     (Blank rows = not tested)  LOWER EXTREMITY MMT:    MMT Right eval Left eval  Hip  flexion Unable to formally assess, able to ambulate independently.  Hip extension   Hip abduction   Hip adduction   Hip internal rotation   Hip external rotation   Knee flexion   Knee extension   Ankle dorsiflexion   Ankle plantarflexion   Ankle inversion   Ankle eversion    (Blank rows = not tested)  LUMBAR SPECIAL TESTS:  Unable to complete due to pt unable to follow cues consistently.  FUNCTIONAL TESTS:  Unable to formally  assess.  GAIT: Distance walked: Various clinic distances. Assistive device utilized: None Level of assistance: SBA and CGA Comments: Pt ambulates with intermittent in-toeing and maintains forward head and trunk, wanders bumping into variously oriented obstacles w/o LOB.  He initiates sitting in W on floor of gym refusing to stand when direction by therapist and caregivers to perform stairs.  TODAY'S TREATMENT:                                                                                                                              DATE: N/A    PATIENT EDUCATION:  Education details: PT POC, assessments used, and goals to be set.  Plan to focus on functional participation to strengthen and improve posture and safe mobility w/ supervision from caregiver as able. Person educated: Patient, Parent, and Caregiver Amy Education method: Explanation Education comprehension: verbalized understanding and needs further education  HOME EXERCISE PROGRAM: To be established.  ASSESSMENT:  CLINICAL IMPRESSION: Patient is a 36 y.o. male who was seen today for physical therapy evaluation and treatment for chronic low back pain.  Pt has never received PT for any condition prior to episode started this visit.  Pt has a significant PMH of autism, incontinence of bowel and bladder, severe anxiety, and seizures .  Identified impairments include prolonged W sitting, intoeing w/ ambulation maintaining forward head and trunk, increased lumbar lordosis, intolerance to standing  and gradual decline in ADL independence since having COVID in 2020.  He would benefit from skilled PT to address impairments as noted and progress towards long term goals.  OBJECTIVE IMPAIRMENTS: Abnormal gait, decreased activity tolerance, decreased balance, decreased cognition, decreased endurance, decreased knowledge of condition, decreased knowledge of use of DME, decreased strength, decreased safety awareness, impaired flexibility, improper body mechanics, and postural dysfunction.   ACTIVITY LIMITATIONS: standing and locomotion level  PARTICIPATION LIMITATIONS: meal prep, cleaning, laundry, medication management, personal finances, interpersonal relationship, driving, shopping, community activity, and occupation  PERSONAL FACTORS: Age, Behavior pattern, Education, Fitness, 1-2 comorbidities: seizures, autism, and pt has never received therapy services according to caregiver and mom  are also affecting patient's functional outcome.   REHAB POTENTIAL: Good  CLINICAL DECISION MAKING: Evolving/moderate complexity  EVALUATION COMPLEXITY: Moderate   GOALS: Goals reviewed with patient? Yes  SHORT TERM GOALS=LONG TERM GOALS: Target date: 07/18/2022  Caregivers to be independent with HEP focused on functional strengthening and positioning. Baseline:  To be initiated. Goal status: INITIAL  2.  Pt will tolerate prone positioning x2 minutes w/ pillow under abdomen to promote low back flexion. Baseline: Inc lumbar lordosis w/o tolerance to positioning. Goal status: INITIAL  3.  Pt will tolerate long-sitting x2 minutes to promote positional tolerate outside of 'W' sitting. Baseline: W sitting 90% of the time Goal status: INITIAL  4.  Caregiver will report pt has not indicated back pain at home more than 3 days a week in order to demonstrate improved pain tolerance. Baseline: Frequent acknowledgement using "ow"  at home. Goal status: INITIAL  5.  Pt will ambulate x230' continuously at no  more than CGA level to improve pt ambulatory tolerance to household distances and decrease caregiver burden. Baseline: Pt ambulates variable distances (~60') in clinic prior to initiating 'W' sitting on floor without warning. Goal status: INITIAL  PLAN:  PT FREQUENCY: every other week (1x due to caregiver request)  PT DURATION: 8 weeks  PLANNED INTERVENTIONS: Therapeutic exercises, Therapeutic activity, Neuromuscular re-education, Balance training, Gait training, Patient/Family education, Self Care, Stair training, DME instructions, Manual therapy, and Re-evaluation.  PLAN FOR NEXT SESSION: trial sitting on red mat on floor vs laying on mat table for rolling/core strength, ring sitting?  Check all possible CPT codes: 16606 - PT Re-evaluation, 97110- Therapeutic Exercise, [phone]- Neuro Re-education, [phone] - Gait Training, [phone] - Manual Therapy, [phone] - Therapeutic Activities, and [phone] - Self Care    Check all conditions that are expected to impact treatment: Cognitive impairment and Psychological disorders   If treatment provided at initial evaluation, no treatment charged due to lack of authorization.    Sadie Haber, PT, DPT 05/23/2022, 5:11 PM

## 2022-06-02 ENCOUNTER — Ambulatory Visit: Payer: Medicaid Other | Admitting: Physical Therapy

## 2022-06-02 ENCOUNTER — Encounter: Payer: Self-pay | Admitting: Physical Therapy

## 2022-06-02 DIAGNOSIS — M6281 Muscle weakness (generalized): Secondary | ICD-10-CM

## 2022-06-02 DIAGNOSIS — M5459 Other low back pain: Secondary | ICD-10-CM

## 2022-06-02 DIAGNOSIS — R2681 Unsteadiness on feet: Secondary | ICD-10-CM

## 2022-06-02 DIAGNOSIS — R2689 Other abnormalities of gait and mobility: Secondary | ICD-10-CM

## 2022-06-03 NOTE — Therapy (Signed)
OUTPATIENT PHYSICAL THERAPY THORACOLUMBAR TREATMENT   Patient Name: Roberto Ramirez MRN: [id number] DOB:Dec 09, 1985, 36 y.o., male Today's Date: 06/03/2022     Past Medical History:  Diagnosis Date   Abnormal gait    Anxiety    Autism spectrum disorder requiring very substantial support (level 3)        Constipation    Depression    Non-verbal learning disorder    Seasonal allergies    Seizures (HCC)    last seizure yrs ago in high school, none as adult controlled with meds   Uses wheelchair    as needed when pt goes shoppjng. medical appts, trips,  Patient can walk but abn gait.   Past Surgical History:  Procedure Laterality Date   CIRCUMCISION  1989   RADIOLOGY WITH ANESTHESIA N/A 11/22/2019   Procedure: MRI WITH ANESTHESIA BRAIN WITH AND WIHTOUT;  Surgeon: Radiologist, Medication, MD;  Location: [facility] OR;  Service: Radiology;  Laterality: N/A;   RADIOLOGY WITH ANESTHESIA N/A 03/20/2022   Procedure: MRI WITH ANESTHESIA-CERVICAL SPINE W/O CONTRAST- THORACIC SPINE W/O CONTRAST- LUMBAR SPINE W/O CONTRAST;  Surgeon: Radiologist, Medication, MD;  Location: [facility] OR;  Service: Radiology;  Laterality: N/A;   Patient Active Problem List   Diagnosis Date Noted   Anorexia 11/30/2019   Fecal incontinence 10/31/2019   Urinary incontinence 11/13/2016   Autism spectrum disorder with accompanying language impairment and intellectual disability, requiring very substantial support 10/18/2014   Mixed receptive-expressive language disorder 10/18/2014   Anxiety state 10/18/2014   Partial epilepsy with impairment of consciousness (HCC) 07/26/2013   Abnormality of gait 07/26/2013   Encounter for long-term (current) use of other medications 07/26/2013    PCP: [facility] per caregiver  REFERRING PROVIDER: Margurite Auerbach, MD  ONSET DATE: 05/01/2022  REFERRING DIAG: 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, 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,G89.29 (ICD-10-CM) - Chronic midline low back pain  without sciatica  THERAPY DIAG:  Other low back pain  Other abnormalities of gait and mobility  Muscle weakness (generalized)  Unsteadiness on feet  Rationale for Evaluation and Treatment: Rehabilitation  SUBJECTIVE:                                                                                                                                                                                             SUBJECTIVE STATEMENT: Caregiver indicates that medicines seem to be controlling back pain as pt has not been indicating pain to her as of late.  They have been working on getting out of the W sitting position. Pt accompanied by: caregiver Amy  PERTINENT HISTORY: Incontinence of bowel and bladder, autism, severe anxiety, and seizures  PAIN:  Are you having pain? Unable to verbalize  PRECAUTIONS: Fall  WEIGHT BEARING RESTRICTIONS: No  FALLS: Has patient fallen in last 6 months? No  LIVING ENVIRONMENT: Lives with: lives with caregiver since 2007 Lives in: House/apartment Stairs: No Has following equipment at home: Wheelchair (manual) and shower chair  PLOF: Needs assistance with ADLs, Needs assistance with homemaking, Needs assistance with gait, and Needs assistance with transfers  PATIENT GOALS: Per caregiver and mom:  tolerate standing more  OBJECTIVE:  TODAY'S TREATMENT:                                                                                                                              DATE: N/A  Session completed in open gym environment.  Extensive discussion of positioning in bed to prevent stressing low back.  Caregiver indicates pt is a stomach sleeper and will prop on elbows in bed sometimes.  Discussed potential strain on low back and trying things like pillows under lower stomach when prone or between knees if side-lying and caregiver indicates pt would be unable to tolerate this.  Pt in W sitting on floor, w/ caregiver assistance pt cued to bring legs out  into bent position in front of pt.  Pt requires prolonged cuing to encourage long-sitting.  In long-sitting PT assists pt in placing hands in lap.  Further had pt tap PT hands at toe level to encourage light hamstring and glut stretching.  Pt prefers to reach w/ LUE only requiring additional stimuli to encourage use of RUE.  Pt begins to perseverate on his name towards end of task no longer participating.  Discussed and provided ABA resources to caregiver to discuss with pt legal guardian/mother.  Caregiver unsure if mother would pay out-of-pocket expense for services.  PT explained benefit of behavioral therapy to caregiver for stimming or potentially harmful behaviors when failing to communicate or overwhelmed and learning more communication signs in place of verbal communication so that he could have more benefit from his day program as well as work towards [employer] training as he was prior to COVID.  Switched tasks to having caregiver physically assist pt into partial ring-sitting (pt prefers caregiver hands-on, is less combative to caregiver assist).  Pt maintains sitting position w/ BUE propping him in position x3 mins, moved hands into lap and maintained for additional 2 minutes.  PT attempts to have pt toss ball, when presented ball pt swats it from therapists.  Caregiver attempts with similar response.  Switched to balloon taps w/ pt eventually engaging in task temporarily.  Pt completes approximately 10 taps prior to disengaging from task and standing away from therapist and caregiver.  PT attempts to redirect pt with eventual redirection to ambulation throughout the gym w/ pt able to complete 220' around gym navigating through obstacles and tight spaces following therapist.  Towards end of ambulation pt grabs genital area squeezing and holding breath requiring therapist and caregiver to redirect attention to  task and prevent self-harming behavior.  Caregiver endorses that pt does this a lot at home to a  worse extent than this.  Pt attempts to leave gym without therapist or caregiver at end of session.  PATIENT EDUCATION:  Education details:  Person educated: Patient and Caregiver Amy Education method: Explanation Education comprehension: verbalized understanding and needs further education  HOME EXERCISE PROGRAM: Continue long-sitting and walking at home.  ASSESSMENT:  CLINICAL IMPRESSION: Patient seen today for skilled PT session requiring various forms of behavioral management throughout session.  Provided ABA resources to caregiver as pt would be best served with multi-disciplinary care towards ultimate goals.  Used functional movements and tasks this session to prevent reliance on positions of high stress on the low back.  He would benefit from further PT services to progress ambulatory tolerance and stretching for low back as tolerated as PT suspects some low back pain related to continuance of 'W' sitting and hip strain.  OBJECTIVE IMPAIRMENTS: Abnormal gait, decreased activity tolerance, decreased balance, decreased cognition, decreased endurance, decreased knowledge of condition, decreased knowledge of use of DME, decreased strength, decreased safety awareness, impaired flexibility, improper body mechanics, and postural dysfunction.   ACTIVITY LIMITATIONS: standing and locomotion level  PARTICIPATION LIMITATIONS: meal prep, cleaning, laundry, medication management, personal finances, interpersonal relationship, driving, shopping, community activity, and occupation  PERSONAL FACTORS: Age, Behavior pattern, Education, Fitness, 1-2 comorbidities: seizures, autism, and pt has never received therapy services according to caregiver and mom  are also affecting patient's functional outcome.   REHAB POTENTIAL: Good  CLINICAL DECISION MAKING: Evolving/moderate complexity  EVALUATION COMPLEXITY: Moderate   GOALS: Goals reviewed with patient? Yes  SHORT TERM GOALS=LONG TERM GOALS:  Target date: 07/18/2022  Caregivers to be independent with HEP focused on functional strengthening and positioning. Baseline:  To be initiated. Goal status: INITIAL  2.  Pt will tolerate prone positioning x2 minutes w/ pillow under abdomen to promote low back flexion. Baseline: Inc lumbar lordosis w/o tolerance to positioning. Goal status: INITIAL  3.  Pt will tolerate long-sitting x2 minutes to promote positional tolerate outside of 'W' sitting. Baseline: W sitting 90% of the time Goal status: INITIAL  4.  Caregiver will report pt has not indicated back pain at home more than 3 days a week in order to demonstrate improved pain tolerance. Baseline: Frequent acknowledgement using "ow" at home. Goal status: INITIAL  5.  Pt will ambulate x230' continuously at no more than CGA level to improve pt ambulatory tolerance to household distances and decrease caregiver burden. Baseline: Pt ambulates variable distances (~60') in clinic prior to initiating 'W' sitting on floor without warning. Goal status: INITIAL  PLAN:  PT FREQUENCY: every other week (1x due to caregiver request)  PT DURATION: 8 weeks  PLANNED INTERVENTIONS: Therapeutic exercises, Therapeutic activity, Neuromuscular re-education, Balance training, Gait training, Patient/Family education, Self Care, Stair training, DME instructions, Manual therapy, and Re-evaluation.  PLAN FOR NEXT SESSION: trial sitting on red mat on floor vs laying on mat table for rolling/core strength, ring sitting?  Check all possible CPT codes: 84166 - PT Re-evaluation, 97110- Therapeutic Exercise, [phone]- Neuro Re-education, [phone] - Gait Training, [phone] - Manual Therapy, [phone] - Therapeutic Activities, and [phone] - Self Care    Check all conditions that are expected to impact treatment: Cognitive impairment and Psychological disorders   If treatment provided at initial evaluation, no treatment charged due to lack of authorization.    Sadie Haber, PT, DPT 06/03/2022, 2:42 PM

## 2022-06-05 ENCOUNTER — Telehealth (INDEPENDENT_AMBULATORY_CARE_PROVIDER_SITE_OTHER): Payer: Self-pay | Admitting: Pediatrics

## 2022-06-05 ENCOUNTER — Other Ambulatory Visit (INDEPENDENT_AMBULATORY_CARE_PROVIDER_SITE_OTHER): Payer: Self-pay | Admitting: Dietician

## 2022-06-05 ENCOUNTER — Encounter (INDEPENDENT_AMBULATORY_CARE_PROVIDER_SITE_OTHER): Payer: Self-pay | Admitting: Dietician

## 2022-06-05 DIAGNOSIS — R633 Feeding difficulties, unspecified: Secondary | ICD-10-CM

## 2022-06-05 DIAGNOSIS — R1312 Dysphagia, oropharyngeal phase: Secondary | ICD-10-CM

## 2022-06-05 DIAGNOSIS — R638 Other symptoms and signs concerning food and fluid intake: Secondary | ICD-10-CM

## 2022-06-05 MED ORDER — NUTRITIONAL SUPPLEMENT PLUS PO LIQD: ORAL | 12 refills | Status: DC

## 2022-06-05 NOTE — Telephone Encounter (Signed)
New order signed, Delorise Shiner to resend.   Lorenz Coaster MD MPH

## 2022-06-05 NOTE — Progress Notes (Signed)
Orders updated for 1 [facility] Standard 1.0 given PO daily.

## 2022-06-05 NOTE — Telephone Encounter (Signed)
  Name of who is calling: Leda Roys contact number: [phone] Fax [phone]  and another fax is [phone]  Provider they see: Dr. Artis Flock  Reason for call: Lanora Manis is calling stating they have not received an order/ fax for the boost to [facility]. She also provided an email [email or web address]

## 2022-06-05 NOTE — Progress Notes (Addendum)
RD refaxed orders for 1 [employer] Standard 1.0 to [place] @ [phone] and securely emailed to [email or web address]

## 2022-06-16 ENCOUNTER — Ambulatory Visit: Payer: Medicaid Other | Admitting: Physical Therapy

## 2022-06-16 ENCOUNTER — Encounter: Payer: Self-pay | Admitting: Physical Therapy

## 2022-06-16 DIAGNOSIS — R2681 Unsteadiness on feet: Secondary | ICD-10-CM

## 2022-06-16 DIAGNOSIS — M6281 Muscle weakness (generalized): Secondary | ICD-10-CM

## 2022-06-16 DIAGNOSIS — M5459 Other low back pain: Secondary | ICD-10-CM | POA: Diagnosis not present

## 2022-06-16 DIAGNOSIS — R2689 Other abnormalities of gait and mobility: Secondary | ICD-10-CM

## 2022-06-16 NOTE — Therapy (Signed)
OUTPATIENT PHYSICAL THERAPY THORACOLUMBAR TREATMENT   Patient Name: Roberto Ramirez MRN: [id number] DOB:[birth year], 36 y.o., male Today's Date: 06/16/2022  PHYSICAL THERAPY DISCHARGE SUMMARY  Visits from Start of Care: 3  Current functional level related to goals / functional outcomes: See clinical impression statement.   Remaining deficits: Primary limitations remain behavioral and developmental requiring services in addition to PT in order to make progress.   Education / Equipment: Edu on discharge plan, request for home health referral and ABA referral as able to promote improved carryover.   Patient agrees to discharge. Patient goals were partially met. Patient is being discharged due to maximized rehab potential.     PT End of Session - 06/16/22 1412     Visit Number 3    Number of Visits 5    Date for PT Re-Evaluation 07/25/22   pushed due to PT limited availability and pt need to stay with same therapist each visit   [place] - Number of Visits 27    PT Start Time 1400    PT Stop Time 1440    PT Time Calculation (min) 40 min    Activity Tolerance Patient tolerated treatment well    Behavior During Therapy [facility] for tasks assessed/performed;Restless;Agitated   pt appears overstimulated w/ inc stimming behaviors like squeezing groin and sitting mid activity             Past Medical History:  Diagnosis Date   Abnormal gait    Anxiety    Autism spectrum disorder requiring very substantial support (level 3)        Constipation    Depression    Non-verbal learning disorder    Seasonal allergies    Seizures ([place])    last seizure yrs ago in high school, none as adult controlled with meds   Uses wheelchair    as needed when pt goes shoppjng. medical appts, trips,  Patient can walk but abn gait.   Past Surgical History:  Procedure Laterality Date   CIRCUMCISION  1989   RADIOLOGY WITH ANESTHESIA N/A  11/22/2019   Procedure: MRI WITH ANESTHESIA BRAIN WITH AND WIHTOUT;  Surgeon: Radiologist, Medication, MD;  Location: [place];  Service: Radiology;  Laterality: N/A;   RADIOLOGY WITH ANESTHESIA N/A 03/20/2022   Procedure: MRI WITH ANESTHESIA-CERVICAL SPINE W/O CONTRAST- THORACIC SPINE W/O CONTRAST- LUMBAR SPINE W/O CONTRAST;  Surgeon: Radiologist, Medication, MD;  Location: [place];  Service: Radiology;  Laterality: N/A;   Patient Active Problem List   Diagnosis Date Noted   Anorexia 11/30/2019   Fecal incontinence 10/31/2019   Urinary incontinence 11/13/2016   Autism spectrum disorder with accompanying language impairment and intellectual disability, requiring very substantial support 10/18/2014   Mixed receptive-expressive language disorder 10/18/2014   Anxiety state 10/18/2014   Partial epilepsy with impairment of consciousness ([place]) 07/26/2013   Abnormality of gait 07/26/2013   Encounter for long-term (current) use of other medications 07/26/2013    PCP: [facility] per caregiver  REFERRING PROVIDER: Rocky Link, MD  ONSET DATE: 05/01/2022  REFERRING DIAG: 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 Pt accompanied by: caregiver Amy  PERTINENT HISTORY: Incontinence of bowel and bladder, autism, severe anxiety, and seizures   PAIN:  Are you having pain? Unable to  verbalize  PRECAUTIONS: Fall  WEIGHT BEARING RESTRICTIONS: No  FALLS: Has patient fallen in last 6 months? No  LIVING ENVIRONMENT: Lives with: lives with caregiver since 2007 Lives in: House/apartment Stairs: No Has following equipment at home: Wheelchair (manual) and shower chair  PLOF: Needs assistance with ADLs, Needs assistance with homemaking, Needs assistance with gait, and Needs assistance with transfers  PATIENT GOALS: Per caregiver and mom:  tolerate standing more  OBJECTIVE:  TODAY'S TREATMENT:                                                                                                                              DATE: N/A  See edu.  Goal assessment.  See goal section for details.  PT ambulates with pt several bouts varying from 10' to 230' w/ focus on behavior management preventing pt from squeezing groin, swatting at therapist, and wandering into various rooms in clinic.  Pt perseverates on his name w/ therapist attempting to redirect pt several times.  Maximum cuing and motivation provided for pt to maintain upright without self-initiating spontaneous W-sitting, several times where caregiver had to attempt getting pt off the floor to participate in therapy w/ pt not attending to cues or various environmental stimuli.  Caregiver on board with seeking home health and ABA services for improved carryover and behavior management as discussed.  PATIENT EDUCATION:  Education details: Extensive discussion on position variance at home including prone over pillow at bedtime.  Discussed stimming behaviors and benefit and need for ABA services if Medicaid will cover.  Discussed referral to home health as pt obviously overstimulated in open gym environment unresponsive to therapist cues and attempts to engage in tasks. Person educated: Patient and Caregiver Amy Education method: Explanation Education comprehension: verbalized understanding and needs further education  HOME  EXERCISE PROGRAM: Continue long-sitting and walking at home.  Prone positioning over pillow at bedtime.  ASSESSMENT:  CLINICAL IMPRESSION: Goals assessed w/ pt meeting 2 of 5 goals.  Significant time spent discussing ongoing needs for ABA services and potential benefit of home health PT for both carryover and improved attention to tasks without overstimulation.  Pt is increasingly anxious this session demonstrating increased stimming and wandering throughout session.  Caregiver in agreement to discharge this visit and for therapist to place request for referral to HHPT and ABA services to assist in promoting pt independence and decreasing caregiver burden.      OBJECTIVE IMPAIRMENTS: Abnormal gait, decreased activity tolerance, decreased balance, decreased cognition, decreased endurance, decreased knowledge of condition, decreased knowledge of use of DME, decreased strength, decreased safety awareness, impaired flexibility, improper body mechanics, and postural dysfunction.   ACTIVITY LIMITATIONS: standing and locomotion level  PARTICIPATION LIMITATIONS:  meal prep, cleaning, laundry, medication management, personal finances, interpersonal relationship, driving, shopping, community activity, and occupation  PERSONAL FACTORS: Age, Behavior pattern, Education, Fitness, 1-2 comorbidities: seizures, autism, and pt has never received therapy services according to caregiver and mom  are also affecting patient's functional outcome.   REHAB POTENTIAL: Good  CLINICAL DECISION MAKING: Evolving/moderate complexity  EVALUATION COMPLEXITY: Moderate   GOALS: Goals reviewed with patient? Yes  SHORT TERM GOALS=LONG TERM GOALS: Target date: 07/18/2022  Caregivers to be independent with HEP focused on functional strengthening and positioning. Baseline:  Basic HEP initiated w/ caregiver compliant to recommendations. Goal status: MET  2.  Pt will tolerate prone positioning x2 minutes w/ pillow under  abdomen to promote low back flexion. Baseline: Inc lumbar lordosis w/o tolerance to positioning.  (11/27) Instructed caregiver to trial at night when going to bed as pt sleeps prone.   Goal status: NOT MET  3.  Pt will tolerate long-sitting x2 minutes to promote positional tolerate outside of 'W' sitting. Baseline: W sitting 90% of the time; pt unwilling to maintain long-sitting or sitting in 90-90 (11/27) Goal status: NOT MET  4.  Caregiver will report pt has not indicated back pain at home more than 3 days a week in order to demonstrate improved pain tolerance. Baseline: Frequent acknowledgement using "ow" at home; Caregiver is unsure if he has had pain recently or not Goal status: NOT MET  5.  Pt will ambulate x230' continuously at no more than CGA level to improve pt ambulatory tolerance to household distances and decrease caregiver burden. Baseline: Pt ambulates variable distances (~60') in clinic prior to initiating 'W' sitting on floor without warning.  230' SBA w/ constant motivation from therapist Goal status: MET  PLAN:  PT FREQUENCY: every other week (1x due to caregiver request)  PT DURATION: 8 weeks  PLANNED INTERVENTIONS: Therapeutic exercises, Therapeutic activity, Neuromuscular re-education, Balance training, Gait training, Patient/Family education, Self Care, Stair training, DME instructions, Manual therapy, and Re-evaluation.  PLAN FOR NEXT SESSION: N/A  Check all possible CPT codes: [phone] - PT Re-evaluation, 97110- Therapeutic Exercise, [phone]- Neuro Re-education, [phone] - Gait Training, [phone] - Manual Therapy, [phone] - Therapeutic Activities, and [phone] - Self Care    Check all conditions that are expected to impact treatment: Cognitive impairment and Psychological disorders   If treatment provided at initial evaluation, no treatment charged due to lack of authorization.    Bary Richard, PT, DPT 06/16/2022, 5:05 PM

## 2022-06-17 ENCOUNTER — Other Ambulatory Visit (INDEPENDENT_AMBULATORY_CARE_PROVIDER_SITE_OTHER): Payer: Self-pay | Admitting: Pediatrics

## 2022-06-17 DIAGNOSIS — F840 Autistic disorder: Secondary | ICD-10-CM

## 2022-06-25 ENCOUNTER — Telehealth (INDEPENDENT_AMBULATORY_CARE_PROVIDER_SITE_OTHER): Payer: Self-pay

## 2022-06-25 DIAGNOSIS — Q7649 Other congenital malformations of spine, not associated with scoliosis: Secondary | ICD-10-CM

## 2022-06-25 DIAGNOSIS — F840 Autistic disorder: Secondary | ICD-10-CM

## 2022-06-25 DIAGNOSIS — A5211 Tabes dorsalis: Secondary | ICD-10-CM

## 2022-06-25 NOTE — Telephone Encounter (Signed)
Contacted pt's Emergency contact to provide [facility] phone number.   After speaking to [place], a representative of [facility], she stated that their patient care coordination team may not have gotten to this pt's referral yet as they have a long list, and it would probably be easier for the pt to call and schedule.   Relayed this message to Amy, pt's emergency contact.   She verbalized understanding of this.   Amy also mentioned the referral to PT did not go as planned. Amy states that the PT couldn't get the pt to do anything she wanted him to do. PT told Amy that she would send in a referral to ABA therapy instead. Amy wanted to know if we heard anything from pt's Physical Therapist regarding this matter.   SS, CCMA

## 2022-06-27 NOTE — Telephone Encounter (Signed)
I have not received any paperwork, but in the last PT note in the chart it states:   "Edu on discharge plan, request for home health referral and ABA referral as able to promote improved carryover.    Patient agrees to discharge. Patient goals were partially met. Patient is being discharged due to maximized rehab potential."

## 2022-06-30 ENCOUNTER — Telehealth (INDEPENDENT_AMBULATORY_CARE_PROVIDER_SITE_OTHER): Payer: Self-pay | Admitting: Pediatrics

## 2022-06-30 NOTE — Addendum Note (Signed)
Addended by: Margurite Auerbach on: 06/30/2022 01:48 PM   Modules accepted: Orders

## 2022-06-30 NOTE — Telephone Encounter (Signed)
Patient failed outpatient PT and home health PT was recommended instead.  I would prefer they still come after counseling to mother that it will be limited to evaluate the home and help mother with strategies.   Lorenz Coaster MD MPH

## 2022-06-30 NOTE — Telephone Encounter (Addendum)
The PT did send me a message that she thought she had done everything she could and she thinks he needs ABA.  I'm not sure if we will be able to find an ABA provider that will take adults, but we can try.  Similarly, I'm not sure he will be able to receive PT through home health but will put in referral.   If these do not work, I did recently find out about a therapist in [place] who specialized in autism, will see adults and takes medicaid. We can try this next if we can't get other services.    Lorenz Coaster MD MPH

## 2022-06-30 NOTE — Telephone Encounter (Signed)
  Name of who is calling: barbara   Caller's Relationship to Patient:  Best contact number: [phone]  Provider they see: Artis Flock  Reason for call: Calling about referral they received for patient, for home health medicaid wont cover physical therapy or covers very limited therapy in the range of 1-3 visits per year. Suggest out patient for the patient instead may be a better option. Odds are he wont benefit from the therapy they will provide and unable to accept his referral at this time. Please follow up     PRESCRIPTION REFILL ONLY  Name of prescription:  Pharmacy:

## 2022-07-01 ENCOUNTER — Ambulatory Visit: Payer: Medicaid Other | Admitting: Physical Therapy

## 2022-07-02 NOTE — Telephone Encounter (Addendum)
Attempted to contact Roberto Ramirez to relay previous message from Dr. Artis Flock.   Roberto Ramirez is in a meeting. Left message with receptionist, Darl Pikes. Ms. Darl Pikes stated that she would relay the message and have Ms. Barbara call back.  SS, CCMA

## 2022-07-18 ENCOUNTER — Ambulatory Visit: Payer: Medicaid Other | Admitting: Physical Therapy

## 2022-07-23 NOTE — Progress Notes (Addendum)
Medical Nutrition Therapy - Progress Note Appt start time: 9:35 AM  Appt end time: 10:13 AM  Reason for referral: Feeding difficulties, ASD, dysphagia Referring provider: Dr. Coralie Keens  Overseeing provider: Dr. Coralie Keens - Feeding Clinic Pertinent medical hx: Epilepsy, ASD, anxiety , urinary/fecal incontinence  Assessment: Food allergies: none  Pertinent Medications: see medication list - mitrazapine Vitamins/Supplements: vitamin D  Pertinent labs:  (8/31) Folate, Iron and TIBC, Ferritin, Vitamin B12, Vitamin B6, CBC - WNL (8/31) CMP: Serum Creatinine - 0.54 (low)  (1/17) Anthropometrics: Ht: 173 cm Wt: 52.5 kg (115 lb 12.8 oz) BMI: 17.4  IBW based on [place] Equation: 64.5 kg (+/- 10%)   05/01/22 Wt: 124 # 12.8 oz (56.6 kg) 03/20/22 Wt: 120 # (54.5 kg) 01/29/22 Wt: 126# (57.3 kg)  09/09/21 Wt: 121# (55 kg) 08/05/21 Wt: 112 # (50.9 kg)  Estimated minimum caloric needs: 2100-2300 kcal/kg/day (EER)  Estimated minimum protein needs: 0.8 g/kg/day  Estimated minimum fluid needs: 1400-1800 mL/day (25-35 mL/kg)   Primary concerns today: Follow-up given pt with ASD and picky eating. AFL accompanied pt to appt today. Appt in conjunction with Lenore Manner, SLP.  Dietary Intake Hx: DME: [facility] Usual eating pattern includes: 2-3 meals and 1 snacks per day. Typically skips lunch, but will have a snack instead. Meal duration: 5 minutes - 1 hour  Feeding skills: fed by caretaker  Chewing/swallowing difficulties with foods or liquids: drooling with liquids and occasionally coughing  Texture modifications: chopped    24-hr recall:  Breakfast: 2 eggs + grits + 4 pieces of bacon + 1 biscuit + 1 banana + ensure OR oatmeal w/ brown sugar and butter + + banana + ensure (eats about half)  Snack: none Lunch: 1 pb + jelly sandwich OR chicken salad sandwich OR potato salad + vienna sausage  Snack: none Dinner: chicken + rice + mixed vegetables + 2.5 biscuits + 2 glasses of juice  Snack: none     Typical Beverages: milk (1%)/almond milk/juice/ water (2-3 cups/day)  Nutrition Supplements: 1 Ensure given once per week  Notes: Caregiver reports that Roberto Ramirez's appetite has continued to wane and flow dependent on Roberto Ramirez's mood and if he is feeling constipated. Caregiver notes that if Roberto Ramirez eats minimal breakfast, then he will consume a larger dinner however still consumes inadequate nutrition and fluid daily. Roberto Ramirez has still not received [employer] supplement, however does receive Ensure (Ensure causes constipation therefore is given once weekly). Caregiver is adding in extra calories where able to ensure each bite prioritizing calories.   Current Therapies: none  Physical Activity: delayed  GI: every 1-2 days (juice and Miralax twice daily)  GU: 2-3x/day (color and saturation varies)    Estimated intake not meeting needs given weight loss Pt consuming various food groups.  Pt likely consuming inadequate amounts of fluids, dairy.   Nutrition Diagnosis: (1/16) Swallowing difficulties related to dysphagia as evidenced by reported coughing and choking with liquids and frequently pocketing food.  Intervention: Discussed pt's weight and current intake. RD securely emailed [facility] to determine continued problem with supplying [employer]. Discussed recommendations below. All questions answered, family in agreement with plan.   Nutrition and SLP Recommendations: - I would recommend calling [facility] customer service to see if they can tell you why they have not been sending the [employer] supplements. Their number is [phone].  - Continue adding in extra calories where you can and prioritize high-fiber, protein rich foods.  - Try the sugar-free flavoring packets to add to water to encourage  Council to drink more fluids. Keep the water out and available throughout the day and encourage Roberto Ramirez to drink every hour.  - Continue encouraging bites and sips to work on hydration and  trigger swallowing.  - Let's start adding in 6-8 scoops of duocal to Roberto Ramirez's foods or beverages per day. Call me when you are running low and I'll a new order.  - Once we get the [employer], please give Roberto Ramirez supplement daily.   Handouts Given at Previous Appointments: - High Calorie, High Protein Foods List  Teach back method used.  Monitoring/Evaluation: Goals to Monitor: - Weight trends - Dietary intake  - Hydration status  Follow-up with Roberto Ramirez on Thursday, June 20th @ 3:30 PM [facility]). Roberto Ramirez will be discharged from feeding clinic at this time and continue follow-up with RD only given biggest concern is weight.  Total time spent in counseling: 38 minutes.

## 2022-07-29 ENCOUNTER — Ambulatory Visit: Payer: Medicaid Other | Admitting: Physical Therapy

## 2022-07-31 NOTE — Progress Notes (Signed)
Patient: Roberto Ramirez MRN: [id number] Sex: male DOB: [birth year]  Provider: Carylon Perches, MD Location of Care: [facility]  Note type: Routine follow-up  History of Present Illness:  Roberto Ramirez is a 37 y.o. male with history of autism, severe anxiety, and seizures who I am seeing for routine follow-up. Patient was last seen on 05/01/22 where I started naproxen, continued all other medications, I also referred to PT and GI, I also recommended increasing his Zoloft.  Since the last appointment, he trialed OP PT however, failed related to his behaviors. PT recommended home health PT and ABA.   Patient presents today with his caregiver, her biggest concern is his weight loss. She still has noticed that he does not have an appetite for his foods. He will eat more at night however, even then he can not be very hungry. Can be worsened by his constipation, although this is improved with increased mirialax.   Continues to try to give miralax BID, however, sometimes he will not take it well. Increase has helped some, but he continues to have days with no stooling.   She has decreased his Klonopin, however, since then she has noticed increased anxiety and shakiness. To prevent triggers they will not go out of the house much.   She is giving the naproxen every day, and she thinks it has improved his pain, says ow less. Not crawling as much.   She reports no seizures since the last visit.   Sleeping well.    Past Medical History Past Medical History:  Diagnosis Date   Abnormal gait    Anxiety    Autism spectrum disorder requiring very substantial support (level 3)        Constipation    Depression    Non-verbal learning disorder    Seasonal allergies    Seizures ([place])    last seizure yrs ago in high school, none as adult controlled with meds   Uses wheelchair    as needed when pt goes shoppjng. medical appts, trips,  Patient can walk but abn  gait.    Surgical History Past Surgical History:  Procedure Laterality Date   CIRCUMCISION  1989   RADIOLOGY WITH ANESTHESIA N/A 11/22/2019   Procedure: MRI WITH ANESTHESIA BRAIN WITH AND WIHTOUT;  Surgeon: Radiologist, Medication, MD;  Location: [place];  Service: Radiology;  Laterality: N/A;   RADIOLOGY WITH ANESTHESIA N/A 03/20/2022   Procedure: MRI WITH ANESTHESIA-CERVICAL SPINE W/O CONTRAST- THORACIC SPINE W/O CONTRAST- LUMBAR SPINE W/O CONTRAST;  Surgeon: Radiologist, Medication, MD;  Location: [place];  Service: Radiology;  Laterality: N/A;    Family History family history includes Cancer in his maternal grandmother; Lung cancer in his paternal grandfather.   Social History Social History   Social History Narrative   Cortavius is a 37 y.o. male.   He attends [employer] in [place].   He lives with his parents. He has two siblings.    Allergies No Known Allergies  Medications Current Outpatient Medications on File Prior to Visit  Medication Sig Dispense Refill   Calcium Citrate-Vitamin D (CALCIUM CITRATE + D PO) Take 2 tablets by mouth daily.     cetirizine (ZYRTEC) 10 MG tablet Take 10 mg by mouth daily.     clonazePAM (KLONOPIN) 1 MG tablet Take 1 mg by mouth 3 (three) times daily.     cloNIDine (CATAPRES) 0.1 MG tablet TAKE ONE HALF (1/2) TABLET BY MOUTH 3 TIMES A DAY AND ONE  TABLET EVERY EVENINGAT 9PM. (ROUND GREEN TABLET) 93 tablet 1   fluticasone (FLONASE) 50 MCG/ACT nasal spray Place 2 sprays into both nostrils daily.     linaclotide (LINZESS) 145 MCG CAPS capsule Take 145 mcg by mouth daily.     naproxen sodium (ALEVE) 220 MG tablet Take 220 mg by mouth daily as needed (pain).     polyethylene glycol (MIRALAX / GLYCOLAX) packet Take 17 g by mouth daily at 6 PM.      pseudoephedrine (SUDAFED) 120 MG 12 hr tablet Take 120 mg by mouth every 12 (twelve) hours as needed for congestion.     cephALEXin (KEFLEX) 500 MG capsule Take 500 mg by mouth 3 (three) times daily.  Started 03/05/22, 14 days (Patient not taking: Reported on 08/07/2022)     ketoconazole (NIZORAL) 2 % cream Apply 1 application  topically 2 (two) times daily as needed for irritation.     Nutritional Supplements (NUTRITIONAL SUPPLEMENT PLUS) LIQD 1 [employer] Standard 1.0 given PO daily. 10075 mL 12   No current facility-administered medications on file prior to visit.   The medication list was reviewed and reconciled. All changes or newly prescribed medications were explained.  A complete medication list was provided to the patient/caregiver.  Physical Exam BP 100/74 (BP Location: Right Arm, Patient Position: Sitting, Cuff Size: Normal)   Ht 5' 8.26" (1.734 m)   Wt 115 lb (52.2 kg)   BMI 17.35 kg/m  Facility age limit for growth %iles is 20 years.  No results found. Gen: well appearing child Skin: No rash, No neurocutaneous stigmata. HEENT: Normocephalic, no dysmorphic features, no conjunctival injection, nares patent, mucous membranes moist, oropharynx clear. Resp: Normal work of breathing.  CV: Appears well perfused Abd: abdomen on-distended.  Ext: No deformities, no muscle wasting.  Neurological Examination: MS: Awake, alert, interactive. Poor eye contact, nonverbal.  Poor attention in room, mostly plays by himself. Cranial Nerves: face symmetric with full strength of facial muscles, hearing intact grossly.  Motor-Moves all extremities at least against gravity. No abnormal movements. W sitting throughout visit, but able to move to cross legged sit. Coordination: No dysmetria with reaching for objects Gait: Able to stand independently, walks independently with stable gait.  Able to tolerate backpack with good balance and seemingly no pain.    Diagnosis: 1. Autism spectrum disorder with accompanying language impairment and intellectual disability, requiring very substantial support   2. Partial epilepsy with impairment of consciousness ([place])   3. Anxiety state      Assessment  and Plan Roberto Ramirez is a 37 y.o. male with history of autism, severe anxiety, and seizures who I am seeing in follow-up. Patient has been losing weight and has decreased appetite. To directly address appetite, will start periactin. Appetite is also affected by constipation, will work to improve this which will indirectly improve appetite as well. Recommend starting senna while continuing Miralax. Will also work to get orders of [employer] delivered and address any delays. For now provided samples of neocate farms.   Mood: Given reports of increased anxiety since decreasing clonazepam, increased sertraline today to 200mg  daily and will keep the clonazepam as is for now, hope to wean off of clonzepam if anxiety improves with this change.   Pain Management: I am glad to hear that his discomfort and pain with standing has improved with naproxen. Will continue this medication with plan to get renal labs at the next visit to ensure there are no side effects from this medication.   -  Started Periactin  - Started Senna 1 tablet q day, an additional tablet PRN for days he does not stool  - Continued Miralax - Previously ordered [employer], patient continues to medically need this supplement  - Increased sertraline  - Continued clonazepam - Continued naproxen, plan for renal labs at next visit   I spent 40 minutes on day of service on this patient including review of chart, discussion with patient and family, discussion of screening results, coordination with other providers and management of orders and paperwork.     Return in about 5 months (around 01/06/2023).  I, Roberto Ramirez, scribed for and in the presence of Roberto Coaster, MD at today's visit on 08/07/2022.   I, Roberto Coaster MD MPH, personally performed the services described in this documentation, as scribed by Roberto Ramirez in my presence on 08/07/2022 and it is accurate, complete, and reviewed by me.    Roberto Coaster MD MPH Neurology  and Neurodevelopment [facility] Neu[facility]  [address] [place], [place], [place] [address] Phone: [phone] Fax: [phone]

## 2022-08-01 ENCOUNTER — Telehealth: Payer: Self-pay

## 2022-08-01 NOTE — Telephone Encounter (Signed)
I contacted Ms. Roberto Ramirez again to follow up on the referral.  Ms. Roberto Ramirez states that the typically don't accept Medicaid patients who have referrals for limited therapies only services. I asked why this is, she says because "they wouldn't get much out of this."  SS, CCMA

## 2022-08-06 ENCOUNTER — Ambulatory Visit (INDEPENDENT_AMBULATORY_CARE_PROVIDER_SITE_OTHER): Payer: Medicaid Other | Admitting: Speech Pathology

## 2022-08-06 ENCOUNTER — Ambulatory Visit (INDEPENDENT_AMBULATORY_CARE_PROVIDER_SITE_OTHER): Payer: Medicaid Other | Admitting: Dietician

## 2022-08-06 VITALS — Wt 115.8 lb

## 2022-08-06 DIAGNOSIS — R6339 Other feeding difficulties: Secondary | ICD-10-CM | POA: Diagnosis not present

## 2022-08-06 DIAGNOSIS — R638 Other symptoms and signs concerning food and fluid intake: Secondary | ICD-10-CM | POA: Diagnosis not present

## 2022-08-06 DIAGNOSIS — R636 Underweight: Secondary | ICD-10-CM | POA: Diagnosis not present

## 2022-08-06 DIAGNOSIS — R1310 Dysphagia, unspecified: Secondary | ICD-10-CM | POA: Diagnosis not present

## 2022-08-06 DIAGNOSIS — R634 Abnormal weight loss: Secondary | ICD-10-CM

## 2022-08-06 NOTE — Patient Instructions (Addendum)
**Note Roberto-Identified via Obfuscation** Nutrition and SLP Recommendations: - I would recommend calling [facility] customer service to see if they can tell you why they have not been sending the [employer] supplements. Their number is [phone].  - Continue adding in extra calories where you can and prioritize high-fiber, protein rich foods.  - Try the sugar-free flavoring packets to add to water to encourage Roberto Ramirez to drink more fluids. Keep the water out and available throughout the day and encourage Roberto Ramirez to drink every hour.  - Continue encouraging bites and sips to work on hydration and trigger swallowing.  - Let's start adding in 6-8 scoops of duocal to Roberto Ramirez's foods or beverages per day. Call me when you are running low and I'll a new order.  - Once we get the [employer], please give Roberto Ramirez supplement daily.   Follow-up with Roberto Ramirez on Thursday, June 20th @ 3:30 PM [facility]).

## 2022-08-06 NOTE — Progress Notes (Signed)
SLP Feeding Evaluation - [facility] Patient Details Name: TIJUAN DANTES MRN: [id number] DOB: [birth year] Today's Date: 08/06/2022  Visit Information:  Reason for referral: Feeding difficulties, ASD, dysphagia Referring provider: Dr. Coralie Keens  Overseeing provider: Dr. Coralie Keens - Feeding Clinic Pertinent medical hx: Epilepsy, ASD, anxiety , urinary/fecal incontinence Visit in conjunction Lutricia Horsfall, RD   General Observations: Keanen was seen with family caregiver during this visit.   Feeding concerns currently: Caregiver reports Damen has decreased PO intake since last appt with SLP/RD. She reported that Tran goes through periods where he does not want to eat likely 2/2 constipation. He continues to make choices/preferences with PO intake via picture cards. He is consuming similar foods as reported at last visit.   Schedule consists of:  24-hr recall:  Breakfast: 2 eggs + grits + 4 pieces of bacon + 1 biscuit + 1 banana + ensure OR oatmeal w/ brown sugar and butter + + banana + ensure  Snack: none Lunch: 1 pb + jelly sandwich OR chicken salad sandwich Snack: none Dinner: chicken + rice + mixed vegetables + 2.5 biscuits + 2 glasses of juice  Snack: none    Typical Beverages: milk (1%)/almond milk/juice/ water (2-3 cups/day)  Nutrition Supplements: 1 Ensure/Boost Caregiver reports he prefers to sit in his room on the bed for meals and typically consumes larger volume PO there.  S/s of Aspiration: none reported today by caregiver    Clinical Impressions: Humzah presents with immature oral skills and risk for aspiration/oral aversion in light of medical hx. Given Kongmeng presents with similar PO skills and maintaining since last appt, SLP recommends d/c from feeding clinic. Caregiver should continue to allow Sulo to make choices via picture cards and encourage positive mealtime routine/experience to reduce oral aversions. Discussed ongoing importance of  liquid wash in between bites to aid in efficiency of oral clearance and increase water intake. It was recommended that Daleon should continue to follow with RD specifically for nutritional needs. Caregiver voiced agreement to plan and recommendations.      Nutrition and SLP Recommendations: - I would recommend calling [facility] customer service to see if they can tell you why they have not been sending the [employer] supplements. Their number is [phone].  - Continue adding in extra calories where you can and prioritize high-fiber, protein rich foods.  - Try the sugar-free flavoring packets to add to water to encourage Jamarion to drink more fluids. Keep the water out and available throughout the day and encourage Raynell to drink every hour.  - Continue encouraging bites and sips to work on hydration and trigger swallowing.  - Let's start adding in 6-8 scoops of duocal to Koki's foods or beverages per day. Call me when you are running low and I'll a new order.  - Once we get the [employer], please give [place] supplement daily.           Aline August., M.A. CCC-SLP  08/06/2022, 10:37 AM

## 2022-08-07 ENCOUNTER — Encounter (INDEPENDENT_AMBULATORY_CARE_PROVIDER_SITE_OTHER): Payer: Self-pay | Admitting: Pediatrics

## 2022-08-07 ENCOUNTER — Ambulatory Visit (INDEPENDENT_AMBULATORY_CARE_PROVIDER_SITE_OTHER): Payer: Medicaid Other | Admitting: Pediatrics

## 2022-08-07 VITALS — BP 100/74 | Ht 68.26 in | Wt 115.0 lb

## 2022-08-07 DIAGNOSIS — F411 Generalized anxiety disorder: Secondary | ICD-10-CM

## 2022-08-07 DIAGNOSIS — G40209 Localization-related (focal) (partial) symptomatic epilepsy and epileptic syndromes with complex partial seizures, not intractable, without status epilepticus: Secondary | ICD-10-CM | POA: Diagnosis not present

## 2022-08-07 DIAGNOSIS — F840 Autistic disorder: Secondary | ICD-10-CM | POA: Diagnosis not present

## 2022-08-07 MED ORDER — CYPROHEPTADINE HCL 4 MG PO TABS: 2.0000 mg | ORAL_TABLET | Freq: Two times a day (BID) | ORAL | 11 refills | Status: DC

## 2022-08-07 MED ORDER — NAPROXEN 500 MG PO TABS: 500.0000 mg | ORAL_TABLET | Freq: Every morning | ORAL | 3 refills | Status: DC

## 2022-08-07 MED ORDER — CARBAMAZEPINE ER 300 MG PO CP12: ORAL_CAPSULE | ORAL | 11 refills | Status: DC

## 2022-08-07 MED ORDER — SERTRALINE HCL 200 MG PO CAPS: 200.0000 mg | ORAL_CAPSULE | Freq: Every day | ORAL | 5 refills | Status: DC

## 2022-08-07 MED ORDER — MIRTAZAPINE 15 MG PO TABS: 15.0000 mg | ORAL_TABLET | Freq: Every day | ORAL | 5 refills | Status: DC

## 2022-08-07 MED ORDER — SENNA 8.6 MG PO TABS: ORAL_TABLET | ORAL | 11 refills | Status: AC

## 2022-08-07 NOTE — Patient Instructions (Addendum)
Increase his sertraline to 200 mg every day (2 tablets).  Started periactin 2 mg twice a day today, this can make him more hungry.  Try giving him Senna, give him one tablet per day. If he does not stool in a day, give him a second dose at night.  Give him Miralax after school with at least 8 oz of water.  Continue Naproxen as is.  Please keep [place] updated if you are not getting the [facility]. In the mean time, we are giving you neocate farms. Give him one of these a day.

## 2022-08-11 ENCOUNTER — Encounter (INDEPENDENT_AMBULATORY_CARE_PROVIDER_SITE_OTHER): Payer: Self-pay | Admitting: Pediatrics

## 2022-08-12 ENCOUNTER — Other Ambulatory Visit (INDEPENDENT_AMBULATORY_CARE_PROVIDER_SITE_OTHER): Payer: Self-pay | Admitting: Pediatrics

## 2022-08-12 ENCOUNTER — Ambulatory Visit: Payer: Medicaid Other | Admitting: Physical Therapy

## 2022-08-12 ENCOUNTER — Encounter: Payer: Self-pay | Admitting: Emergency Medicine

## 2022-08-12 DIAGNOSIS — F840 Autistic disorder: Secondary | ICD-10-CM

## 2022-08-26 ENCOUNTER — Ambulatory Visit: Payer: Medicaid Other | Admitting: Physical Therapy

## 2022-09-08 ENCOUNTER — Ambulatory Visit: Payer: Medicaid Other | Admitting: Physical Therapy

## 2022-12-29 ENCOUNTER — Other Ambulatory Visit (INDEPENDENT_AMBULATORY_CARE_PROVIDER_SITE_OTHER): Payer: Self-pay

## 2022-12-31 ENCOUNTER — Other Ambulatory Visit (INDEPENDENT_AMBULATORY_CARE_PROVIDER_SITE_OTHER): Payer: Self-pay

## 2022-12-31 MED ORDER — NAPROXEN 500 MG PO TABS: 500.0000 mg | ORAL_TABLET | Freq: Every morning | ORAL | 0 refills | Status: DC

## 2023-01-08 ENCOUNTER — Ambulatory Visit (INDEPENDENT_AMBULATORY_CARE_PROVIDER_SITE_OTHER): Payer: Self-pay | Admitting: Dietician

## 2023-01-08 ENCOUNTER — Ambulatory Visit (INDEPENDENT_AMBULATORY_CARE_PROVIDER_SITE_OTHER): Payer: Medicaid Other | Admitting: Pediatrics

## 2023-01-26 ENCOUNTER — Other Ambulatory Visit (INDEPENDENT_AMBULATORY_CARE_PROVIDER_SITE_OTHER): Payer: Self-pay

## 2023-01-26 DIAGNOSIS — F840 Autistic disorder: Secondary | ICD-10-CM

## 2023-01-28 MED ORDER — CLONIDINE HCL 0.1 MG PO TABS
ORAL_TABLET | ORAL | 0 refills | Status: DC
Start: 2023-01-28 — End: 2023-01-28

## 2023-01-28 MED ORDER — CLONIDINE HCL 0.1 MG PO TABS: ORAL_TABLET | ORAL | 5 refills | Status: DC

## 2023-01-28 NOTE — Telephone Encounter (Signed)
Contacted patients mother. Scheduled follow up appointment 9.30.2024 at 2:45 PM.  SS, CCMA

## 2023-01-28 NOTE — Addendum Note (Signed)
Addended by: Margurite Auerbach on: 01/28/2023 01:51 PM   Modules accepted: Orders

## 2023-01-28 NOTE — Telephone Encounter (Signed)
I refilled prescription, but please do reschedule patient follow-up.   Lorenz Coaster MD MPH

## 2023-02-02 ENCOUNTER — Other Ambulatory Visit (INDEPENDENT_AMBULATORY_CARE_PROVIDER_SITE_OTHER): Payer: Self-pay | Admitting: Pediatrics

## 2023-02-10 ENCOUNTER — Other Ambulatory Visit (INDEPENDENT_AMBULATORY_CARE_PROVIDER_SITE_OTHER): Payer: Self-pay | Admitting: Pediatrics

## 2023-03-02 ENCOUNTER — Other Ambulatory Visit (INDEPENDENT_AMBULATORY_CARE_PROVIDER_SITE_OTHER): Payer: Self-pay | Admitting: Family

## 2023-04-09 ENCOUNTER — Other Ambulatory Visit (INDEPENDENT_AMBULATORY_CARE_PROVIDER_SITE_OTHER): Payer: Self-pay | Admitting: Pediatrics

## 2023-04-20 ENCOUNTER — Encounter (INDEPENDENT_AMBULATORY_CARE_PROVIDER_SITE_OTHER): Payer: Self-pay | Admitting: Pediatrics

## 2023-04-20 ENCOUNTER — Ambulatory Visit (INDEPENDENT_AMBULATORY_CARE_PROVIDER_SITE_OTHER): Payer: MEDICAID | Admitting: Pediatrics

## 2023-04-20 DIAGNOSIS — G4709 Other insomnia: Secondary | ICD-10-CM

## 2023-04-20 DIAGNOSIS — F411 Generalized anxiety disorder: Secondary | ICD-10-CM

## 2023-04-20 DIAGNOSIS — R1312 Dysphagia, oropharyngeal phase: Secondary | ICD-10-CM

## 2023-04-20 DIAGNOSIS — G40209 Localization-related (focal) (partial) symptomatic epilepsy and epileptic syndromes with complex partial seizures, not intractable, without status epilepticus: Secondary | ICD-10-CM

## 2023-04-20 DIAGNOSIS — K5900 Constipation, unspecified: Secondary | ICD-10-CM

## 2023-04-20 NOTE — Patient Instructions (Signed)
Referred to psychiatry. They will call you to schedule.  Referred to [facility]. Their number is [phone]. They will call you to schedule.  Refilled cyproheptadine, carbatrol, and Remeron Signed a release of information for [facility] to send [place] labs and medical records

## 2023-05-11 ENCOUNTER — Other Ambulatory Visit (INDEPENDENT_AMBULATORY_CARE_PROVIDER_SITE_OTHER): Payer: Self-pay | Admitting: Pediatrics

## 2023-05-13 ENCOUNTER — Other Ambulatory Visit (INDEPENDENT_AMBULATORY_CARE_PROVIDER_SITE_OTHER): Payer: Self-pay | Admitting: Pediatrics

## 2023-05-26 ENCOUNTER — Encounter (INDEPENDENT_AMBULATORY_CARE_PROVIDER_SITE_OTHER): Payer: Self-pay | Admitting: Pediatrics

## 2023-05-26 MED ORDER — CARBAMAZEPINE ER 300 MG PO CP12
ORAL_CAPSULE | ORAL | 11 refills | Status: DC
Start: 2023-05-26 — End: 2024-06-06

## 2023-05-26 MED ORDER — SERTRALINE HCL 100 MG PO TABS: 100.0000 mg | ORAL_TABLET | Freq: Every day | ORAL | 2 refills | Status: DC

## 2023-05-26 MED ORDER — MIRTAZAPINE 15 MG PO TABS: 15.0000 mg | ORAL_TABLET | Freq: Every day | ORAL | 5 refills | Status: DC

## 2023-05-26 MED ORDER — CYPROHEPTADINE HCL 4 MG PO TABS: 2.0000 mg | ORAL_TABLET | Freq: Two times a day (BID) | ORAL | 11 refills | Status: DC

## 2023-06-11 ENCOUNTER — Encounter: Payer: Self-pay | Admitting: Gastroenterology

## 2023-06-24 ENCOUNTER — Ambulatory Visit: Payer: MEDICAID | Admitting: Gastroenterology

## 2023-06-26 ENCOUNTER — Encounter: Payer: Self-pay | Admitting: Physician Assistant

## 2023-06-26 ENCOUNTER — Ambulatory Visit (INDEPENDENT_AMBULATORY_CARE_PROVIDER_SITE_OTHER): Payer: MEDICAID | Admitting: Physician Assistant

## 2023-06-26 ENCOUNTER — Ambulatory Visit (INDEPENDENT_AMBULATORY_CARE_PROVIDER_SITE_OTHER)
Admission: RE | Admit: 2023-06-26 | Discharge: 2023-06-26 | Disposition: A | Discharge status: Home / Self Care | Payer: MEDICAID | Source: Ambulatory Visit | Attending: Physician Assistant | Admitting: Physician Assistant

## 2023-06-26 VITALS — BP 104/74 | HR 129 | Ht 68.26 in | Wt 143.4 lb

## 2023-06-26 DIAGNOSIS — K5909 Other constipation: Secondary | ICD-10-CM

## 2023-06-26 MED ORDER — LINACLOTIDE 290 MCG PO CAPS: 290.0000 ug | ORAL_CAPSULE | Freq: Every day | ORAL | 11 refills | Status: AC

## 2023-06-26 NOTE — Patient Instructions (Addendum)
Increase to Linzess 290 mcg- take 1 daily  Use Miralax 17 grams daily in 8 ounces of water. Give 2 doses daily if no bowel movement in 3 days  Call & speak to Amy's nurse if the new dosage of Linzess is not working well.  Your provider has requested that you have an abdominal x ray before leaving today. Please go to the basement floor to our Radiology department for the test.  Due to recent changes in healthcare laws, you may see the results of your imaging and laboratory studies on MyChart before your provider has had a chance to review them.  We understand that in some cases there may be results that are confusing or concerning to you. Not all laboratory results come back in the same time frame and the provider may be waiting for multiple results in order to interpret others.  Please give [place] 48 hours in order for your provider to thoroughly review all the results before contacting the office for clarification of your results.   It was a pleasure to see you today!  Thank you for trusting me with your gastrointestinal care!

## 2023-06-26 NOTE — Progress Notes (Signed)
Subjective:    Patient ID: Roberto Ramirez, male    DOB: January 10, 1986, 37 y.o.   MRN: [id number]  HPI  Roberto Ramirez is a 37 yo WM, new to GI today referred by Mauricio Po NP/primary care for evaluation of chronic constipation.  Patient has history of severe autism with accompanying language impairment and intellectual disability, also with history of anxiety/depression, seizure disorder. He comes in today with his mother and his primary caregiver.  Mom says that he did have GI evaluation through pediatric gastroenterology at [facility] many years ago and has had problems with constipation as a lifelong issue.  She says with this extensive evaluation they determined that he just has slow motility. His caregiver feels that he has had some increase in constipation over the past year or so.  Currently is on a combination of MiraLAX 17 g in 8 ounces of water daily senna tablets 1-2 at bedtime and Linzess 145 mcg daily.  With this he will have a normal bowel movement for 3 or 4 days, then sometimes will go as long as 3 to 4 days without a bowel movement.  He is not really having hard stools.  They are unaware of any abdominal discomfort or cramping excessive straining, no melena or hematochezia.  His caregiver feels that he does eat less if it has been 3 or 4 days since a bowel movement.  Apparently he did have trouble with anorexia couple of years ago and had lost weight but that has resolved.  No family history of colon cancer, maternal grandfather did have some sort of bowel disorder in his elderly years.   Review of Systems Pertinent positive and negative review of systems were noted in the above HPI section.  All other review of systems was otherwise negative.   Outpatient Encounter Medications as of 06/26/2023  Medication Sig   carbamazepine (CARBATROL) 300 MG 12 hr capsule TAKE TWO (2) CAPSULES BY MOUTH TWICE A DAY   cetirizine (ZYRTEC) 10 MG tablet Take 10 mg by mouth daily.   clonazePAM (KLONOPIN) 1 MG  tablet Take 1 mg by mouth 3 (three) times daily.   cloNIDine (CATAPRES) 0.1 MG tablet TAKE ONE HALF (1/2) TABLET BY MOUTH 3 TIMES A DAY AND ONE TABLET EVERY EVENINGAT 9PM. (ROUND GREEN TABLET)   cyproheptadine (PERIACTIN) 4 MG tablet Take 0.5 tablets (2 mg total) by mouth 2 (two) times daily. 30 minutes before breakfast and dinner   fluticasone (FLONASE) 50 MCG/ACT nasal spray Place 2 sprays into both nostrils daily.   linaclotide (LINZESS) 290 MCG CAPS capsule Take 1 capsule (290 mcg total) by mouth daily.   mirtazapine (REMERON) 15 MG tablet Take 1 tablet (15 mg total) by mouth at bedtime.   naproxen (NAPROSYN) 500 MG tablet TAKE ONE TABLET BY MOUTH EVERY MORNING. (LIGHT YELLOW OBLONG TABLET WITH SG 436)   Nutritional Supplements (NUTRITIONAL SUPPLEMENT PLUS) LIQD 1 [employer] Standard 1.0 given PO daily.   polyethylene glycol (MIRALAX / GLYCOLAX) packet Take 17 g by mouth daily at 6 PM.    pseudoephedrine (SUDAFED) 120 MG 12 hr tablet Take 120 mg by mouth every 12 (twelve) hours as needed for congestion.   senna (SENOKOT) 8.6 MG TABS tablet 1 tablet daily.  May give second dose for no stool daily.   sertraline (ZOLOFT) 100 MG tablet Take 1 tablet (100 mg total) by mouth daily.   [DISCONTINUED] sertraline (ZOLOFT) 100 MG tablet TAKE TWO TABLETS (=200MG ) BY MOUTH DAILY. (YELLOW OBLONG TABLET)   Calcium  Citrate-Vitamin D (CALCIUM CITRATE + D PO) Take 2 tablets by mouth daily. (Patient not taking: Reported on 04/20/2023)   ketoconazole (NIZORAL) 2 % cream Apply 1 application  topically 2 (two) times daily as needed for irritation. (Patient not taking: Reported on 04/20/2023)   linaclotide (LINZESS) 145 MCG CAPS capsule Take 145 mcg by mouth daily. (Patient not taking: Reported on 04/20/2023)   No facility-administered encounter medications on file as of 06/26/2023.   No Known Allergies Patient Active Problem List   Diagnosis Date Noted   Anorexia 11/30/2019   Fecal incontinence 10/31/2019    Urinary incontinence 11/13/2016   Autism spectrum disorder with accompanying language impairment and intellectual disability, requiring very substantial support 10/18/2014   Mixed receptive-expressive language disorder 10/18/2014   Anxiety state 10/18/2014   Partial epilepsy with impairment of consciousness ([facility]) 07/26/2013   Abnormality of gait 07/26/2013   Encounter for long-term (current) use of other medications 07/26/2013   Social History   Socioeconomic History   Marital status: Unknown    Spouse name: Not on file   Number of children: Not on file   Years of education: Not on file   Highest education level: Not on file  Occupational History   Not on file  Tobacco Use   Smoking status: Never   Smokeless tobacco: Never  Vaping Use   Vaping status: Never Used  Substance and Sexual Activity   Alcohol use: No    Alcohol/week: 0.0 standard drinks of alcohol   Drug use: No   Sexual activity: Never  Other Topics Concern   Not on file  Social History Narrative   Roberto Ramirez is a 37 y.o. male.   He attends [employer] in [place].   He lives with his parents. He has two siblings.   Social Determinants of Health   Financial Resource Strain: Not on file  Food Insecurity: Not on file  Transportation Needs: Not on file  Physical Activity: Not on file  Stress: Not on file  Social Connections: Not on file  Intimate Partner Violence: Not on file    Roberto Ramirez family history includes Cancer in his maternal grandmother; Lung cancer in his paternal grandfather.      Objective:    Vitals:   06/26/23 0831  BP: 104/74  Pulse: (!) 129  SpO2: 94%    Physical Exam.Well-developed well-nourished WM in no acute distress.  Patient is sitting on the floor, accompanied by his mother and a caregiver Weight, 143 BMI 21.6  HEENT; nontraumatic normocephalic, EOMI, PE R LA, sclera anicteric. Oropharynx; not examined today Neck; supple, no JVD Cardiovascular; regular rate and rhythm  with S1-S2, no murmur rub or gallop Pulmonary; Clear bilaterally Abdomen; soft, nontender, nondistended, no palpable mass or hepatosplenomegaly, bowel sounds are active Rectal; not done today Skin; benign exam, no jaundice rash or appreciable lesions Extremities; no clubbing cyanosis or edema skin warm and dry Neuro/Psych; patient allows exam, no direct verbal communication       Assessment & Plan:   #18 37 year old male with autism and associated intellectual disability, language impairment who is cared for at home by a caregiver. #2 seizure disorder #3.  History of anxiety/depression #4.  Chronic lifelong constipation for an patient had workup as a child through [facility] which was negative other than slow motility. Some increase in constipation over the past few years. Currently on a regimen of MiraLAX 17 g in 8 ounces of water, daily, senna tablets 1-2 at bedtime and Linzess 145 mcg daily. His  bowel habits fluctuate, not having hard stools but may go 3 to 4 days between bowel movements and then have a normal bowel movement for 3 to 4 days. Suspect this is slow transit constipation, and likely some component secondary to medications  Plan; we reviewed healthy diet with plenty of fruits and vegetables, apparently does not drink fluids well, increase water/fluid intake. Will try increasing Linzess to 290 mcg daily, samples were given today and prescription was sent. Continue MiraLAX 17 g in 8 ounces of water daily.  If needed can also continue senna 1-2 at bedtime. I have asked the patient's caregiver to call back if this regimen is not working well or if we need to adjust dosages. Patient will be established with Dr. Barron Alvine, happy to see him in follow-up as needed.      Alaa Mullally Oswald Hillock PA-C 06/26/2023   Cc: Erskine Emery, NP

## 2023-07-06 ENCOUNTER — Other Ambulatory Visit: Payer: Self-pay | Admitting: Physician Assistant

## 2023-07-06 DIAGNOSIS — K5909 Other constipation: Secondary | ICD-10-CM

## 2023-07-08 ENCOUNTER — Telehealth: Payer: Self-pay | Admitting: Physician Assistant

## 2023-07-08 NOTE — Telephone Encounter (Signed)
Inbound call from patient's mother returning call.

## 2023-07-10 NOTE — Telephone Encounter (Signed)
See imaging results from 07/07/23.

## 2023-07-13 NOTE — Progress Notes (Signed)
Agree with the assessment and plan as outlined by Amy Esterwood, PA-C.  Jeremy Mclamb, DO, FACG  

## 2023-07-23 ENCOUNTER — Ambulatory Visit (INDEPENDENT_AMBULATORY_CARE_PROVIDER_SITE_OTHER): Payer: MEDICAID | Admitting: Pediatrics

## 2023-08-11 ENCOUNTER — Other Ambulatory Visit (INDEPENDENT_AMBULATORY_CARE_PROVIDER_SITE_OTHER): Payer: Self-pay | Admitting: Pediatrics

## 2023-08-11 DIAGNOSIS — F840 Autistic disorder: Secondary | ICD-10-CM

## 2023-09-16 NOTE — Progress Notes (Signed)
 Patient: Roberto Ramirez MRN: [id number] Sex: male DOB: [birth year]  Provider: Lorenz Coaster, MD Location of Care: [facility]  Note type: Routine follow-up  History of Present Illness:  Roberto Ramirez is a 38 y.o. male with history of autism, severe anxiety, and seizures who I am seeing for routine follow-up. Patient was last seen on 04/20/2023 where I referred to psychiatry and GI, continued cyproheptadine, carbatrol, and Remeron, and signed a release of information for Roberto Ramirez to send [place] records and labs. Since the last appointment, patient saw Roberto Gip, PA with GI on 06/26/2023 who recommended increased fluids, continued Miralax, and increased Linzess. Psychiatry has not been scheduled.   Patient presents today with guardian who reports the following:     Guardian is reporting that anxiety is the same, no improvements, maybe a little bit worse because he can't go anywhere. Has had trouble going into the community since COVID in 2020. When out, he will shake and grab his guardian, try to pull her to the car, also does not want to sit in wheelchair and prefers to sit on the floor, this may be associated with his back pain due to spinal stenosis. He does go to [facility] every day to eat and is able to tolerate that. Guardian has services 10 hours per week that allow her to run errands but is sad that he cannot go out anymore  Attends day program, guardian does report some anxiety at the day program, which looks like shaking and holding onto a staff member at the day program. They are working on getting him to sit and got him a new chair. There was a short break in 2020, but patient has attended day program since then. Staff switches every two weeks at the day program, which is hard on Roberto Ramirez.   Guardian reports that he wears a compression vest when anxiety is especially high, which helps him. He will wear all day if he has a hard morning.  Anxiety generally better in the morning and worse at night, anxiety is highest after lunch. He takes Remeron at night.   Overall, compared to two years ago, anxiety a bit better, wondering if his increased weight makes a difference in his medication dosing. Started clonidine when seizures were addressed a few years ago for anxiety, wondering if it is still helpful for anxiety due to weight gain.   Giving clonidine 1/2 tablet 7,1/2 tablet 12, 1/2 tablet 4,1 tablet 9  Sleeps well, he takes medications 8:15-8:30, after 20 mins starts to fall asleep. Sometimes sleeps in the car. Sleeps all night since he started the Remeron  Never heard from psychiatry, referral to psychiatry was sent to [facility] but mom never heard from them. Family prefers homeopathic remedies, wants "safer" options, guardian wants him to have less anxiety  Has had OT before but they were frustrated he didn't have enough fine motor skills  Skin on knee starting to break down due to sitting position  PCP referred to GI and she was able to see him, appetite is good, weight gain has been good, family happy with Periactin, eats at [facility], packs lunch for day program, has [facility] at dinner time with Roberto Ramirez   Past Medical History Past Medical History:  Diagnosis Date   Abnormal gait    Anxiety    Autism spectrum disorder requiring very substantial support (level 3)        Constipation    Depression  Non-verbal learning disorder    Seasonal allergies    Seizures (HCC)    last seizure yrs ago in high school, none as adult controlled with meds   Uses wheelchair    as needed when pt goes shoppjng. medical appts, trips,  Patient can walk but abn gait.    Surgical History Past Surgical History:  Procedure Laterality Date   CIRCUMCISION  1989   RADIOLOGY WITH ANESTHESIA N/A 11/22/2019   Procedure: MRI WITH ANESTHESIA BRAIN WITH AND WIHTOUT;  Surgeon: Radiologist, Medication, MD;  Location: [facility] OR;  Service: Radiology;   Laterality: N/A;   RADIOLOGY WITH ANESTHESIA N/A 03/20/2022   Procedure: MRI WITH ANESTHESIA-CERVICAL SPINE W/O CONTRAST- THORACIC SPINE W/O CONTRAST- LUMBAR SPINE W/O CONTRAST;  Surgeon: Radiologist, Medication, MD;  Location: [facility] OR;  Service: Radiology;  Laterality: N/A;    Family History family history includes Cancer in his maternal grandmother; Lung cancer in his paternal grandfather.   Social History Social History   Social History Narrative   [name] is a 38 y.o. male.   He attends [facility] in [place].   He lives with his parents. He has two siblings.    Allergies No Known Allergies  Medications Current Outpatient Medications on File Prior to Visit  Medication Sig Dispense Refill   carbamazepine (CARBATROL) 300 MG 12 hr capsule TAKE TWO (2) CAPSULES BY MOUTH TWICE A DAY 120 capsule 11   cetirizine (ZYRTEC) 10 MG tablet Take 10 mg by mouth daily.     clonazePAM (KLONOPIN) 1 MG tablet Take 1 mg by mouth 3 (three) times daily.     cyproheptadine (PERIACTIN) 4 MG tablet Take 0.5 tablets (2 mg total) by mouth 2 (two) times daily. 30 minutes before breakfast and dinner 30 tablet 11   fluticasone (FLONASE) 50 MCG/ACT nasal spray Place 2 sprays into both nostrils daily.     ketoconazole (NIZORAL) 2 % cream Apply 1 application  topically 2 (two) times daily as needed for irritation.     linaclotide (LINZESS) 290 MCG CAPS capsule Take 1 capsule (290 mcg total) by mouth daily. 30 capsule 11   mirtazapine (REMERON) 15 MG tablet Take 1 tablet (15 mg total) by mouth at bedtime. 30 tablet 5   naproxen (NAPROSYN) 500 MG tablet TAKE ONE TABLET BY MOUTH EVERY MORNING. (LIGHT YELLOW OBLONG TABLET WITH SG 436) 31 tablet 2   polyethylene glycol (MIRALAX / GLYCOLAX) packet Take 17 g by mouth daily at 6 PM.      pseudoephedrine (SUDAFED) 120 MG 12 hr tablet Take 120 mg by mouth every 12 (twelve) hours as needed for congestion.     senna (SENOKOT) 8.6 MG TABS tablet 1 tablet daily.  May give  second dose for no stool daily. 120 tablet 11   sertraline (ZOLOFT) 100 MG tablet Take 2 tablets (200 mg total) by mouth daily. 60 tablet 0   sertraline (ZOLOFT) 100 MG tablet Take 1 tablet (100 mg total) by mouth daily. 30 tablet 0   No current facility-administered medications on file prior to visit.   The medication list was reviewed and reconciled. All changes or newly prescribed medications were explained.  A complete medication list was provided to the patient/caregiver.  Physical Exam BP 104/76 (BP Location: Left Arm, Patient Position: Sitting, Cuff Size: Normal)   Pulse 64   Wt 141 lb (64 kg)   BMI 21.28 kg/m  Facility age limit for growth %iles is 20 years.  No results found. Roberto: NAD, well nourished adult with  autism HEENT: normocephalic, no eye or nose discharge.  MMM  Cardiovascular: warm and well perfused Lungs: Normal work of breathing, no rhonchi or stridor Skin: No birthmarks, no skin breakdown Abdomen: soft, non tender, non distended Extremities: No contractures or edema. Neuro: EOM intact, face symmetric. Moves all extremities equally and at least antigravity. No abnormal movements. W sitting for majority of appointment, but able to stable and ambulate independently.    Diagnosis: 1. Anxiety state   2. Autism spectrum disorder with accompanying intellectual impairment, requiring substantial support (level 2)   3. Partial epilepsy with impairment of consciousness (HCC)   4. Other insomnia   5. Oropharyngeal dysphagia   6. Feeding difficulties   7. Increased nutritional needs      Assessment and Plan KAIAN FAHS is a 38 y.o. male with history of autism, severe anxiety, and seizures who I am seeing in follow-up. Patient has had little to no improvement in anxiety in the past year. Zoloft has been helpful, but not effective enough. Increased Clonidine to 1 tablet TID and recommended adjusting the times to when patient's anxiety is highest. Consider starting  buspar at next appointment. Discussed strategies to work on adjusting patient to the community. Recommended a weighted vest and tool kits to help with these strategies. Patient would benefit from sensory integration so referred to OT to work on that and activities of daily living.   Give Clonidine 1 tablet TID Continue Periactin, Zoloft, Remeron We will consider Buspar at the next appointment for anxiety Rashard's weight is looking great. Please stop [facility]. We will call [facility] to let them know Continue [place] Recommended continuing using the compression vest or a weighted vest daily Recommended working on getting Clarks back into the community. Try going to somewhere such as a restaurant and sitting in the parking lot. You can try this a few times before attempting to go inside. Build up until he able to sit through a whole meal at a restaurant Recommended [email or web address].[email or web address] to look at tool kits to help build routines Referred to OT to work on sensory integration and activities of daily living. We will work on finding a closer option to [employer].   I spent 75 minutes on day of service on this patient including review of chart, discussion with patient and family, discussion of screening results, coordination with other providers and management of orders and paperwork.     Return in about 1 month (around 10/25/2023).  I, Feliciana Rossetti, scribed for and in the presence of Roberto Coaster, MD at today's visit on 09/24/2023.  I, Roberto Coaster MD MPH, personally performed the services described in this documentation, as scribed by Feliciana Rossetti in my presence on 09/24/2023 and it is accurate, complete, and reviewed by me.    Roberto Coaster MD MPH Neurology and Neurodevelopment [facility] Neu[facility]  [address] [place], [place], [place] [address] Phone: [phone] Fax: [phone]

## 2023-09-17 ENCOUNTER — Other Ambulatory Visit (INDEPENDENT_AMBULATORY_CARE_PROVIDER_SITE_OTHER): Payer: Self-pay

## 2023-09-17 MED ORDER — SERTRALINE HCL 100 MG PO TABS: 100.0000 mg | ORAL_TABLET | Freq: Every day | ORAL | 0 refills | Status: DC

## 2023-09-17 MED ORDER — SERTRALINE HCL 100 MG PO TABS: 200.0000 mg | ORAL_TABLET | Freq: Every day | ORAL | 0 refills | Status: DC

## 2023-09-24 ENCOUNTER — Ambulatory Visit (INDEPENDENT_AMBULATORY_CARE_PROVIDER_SITE_OTHER): Payer: MEDICAID | Admitting: Pediatrics

## 2023-09-24 ENCOUNTER — Encounter (INDEPENDENT_AMBULATORY_CARE_PROVIDER_SITE_OTHER): Payer: Self-pay | Admitting: Pediatrics

## 2023-09-24 VITALS — BP 104/76 | HR 64 | Wt 141.0 lb

## 2023-09-24 DIAGNOSIS — R633 Feeding difficulties, unspecified: Secondary | ICD-10-CM

## 2023-09-24 DIAGNOSIS — G4709 Other insomnia: Secondary | ICD-10-CM | POA: Diagnosis not present

## 2023-09-24 DIAGNOSIS — R638 Other symptoms and signs concerning food and fluid intake: Secondary | ICD-10-CM

## 2023-09-24 DIAGNOSIS — F411 Generalized anxiety disorder: Secondary | ICD-10-CM

## 2023-09-24 DIAGNOSIS — R1312 Dysphagia, oropharyngeal phase: Secondary | ICD-10-CM

## 2023-09-24 DIAGNOSIS — F840 Autistic disorder: Secondary | ICD-10-CM

## 2023-09-24 DIAGNOSIS — G40209 Localization-related (focal) (partial) symptomatic epilepsy and epileptic syndromes with complex partial seizures, not intractable, without status epilepticus: Secondary | ICD-10-CM | POA: Diagnosis not present

## 2023-09-24 DIAGNOSIS — F79 Unspecified intellectual disabilities: Secondary | ICD-10-CM

## 2023-09-24 MED ORDER — NUTRITIONAL SUPPLEMENT PLUS PO LIQD
ORAL | 12 refills | Status: AC
Start: 2023-09-24 — End: ?

## 2023-09-24 MED ORDER — CLONIDINE HCL 0.1 MG PO TABS: ORAL_TABLET | ORAL | 5 refills | Status: DC

## 2023-09-24 NOTE — Patient Instructions (Addendum)
 Give Clonidine 1 tablet at 12, 1 tablet at 4, and 1 tablet at 9 Continue Periactin We will consider Buspar at the next appointment for anxiety Arsal's weight is looking great. Please stop Duocal. We will call [facility] to let them know Continue Molli Posey I recommend continuing using the compression vest or a weighted vest daily I recommend working on getting Rice back into the community. Try going to somewhere such as a restaurant and sitting in the parking lot. You can try this a few times before attempting to go inside. Build up until he able to sit through a whole meal at a restaurant Please visit [email or web address].org to look at tool kits to help build routines Referred to OT to work on sensory integration and activities of daily living. We will work on finding a closer option to [employer].

## 2023-09-29 ENCOUNTER — Telehealth (INDEPENDENT_AMBULATORY_CARE_PROVIDER_SITE_OTHER): Payer: Self-pay | Admitting: Pediatrics

## 2023-09-29 DIAGNOSIS — F840 Autistic disorder: Secondary | ICD-10-CM

## 2023-09-29 MED ORDER — CLONIDINE HCL 0.1 MG PO TABS: ORAL_TABLET | ORAL | 5 refills | Status: DC

## 2023-09-29 NOTE — Telephone Encounter (Signed)
  Name of who is calling: Amy  Caller's Relationship to Patient: caretaker  Best contact number: [phone]  Provider they see: Artis Flock  Reason for call: Amy stated that the new Rx has not been received & pt is running low on medication     PRESCRIPTION REFILL ONLY  Name of prescription: Clonidine   Pharmacy: [facility] [phone]

## 2023-09-29 NOTE — Telephone Encounter (Signed)
 Resent the RX to pharmacy.   SS, CCMA

## 2023-10-12 ENCOUNTER — Encounter (INDEPENDENT_AMBULATORY_CARE_PROVIDER_SITE_OTHER): Payer: Self-pay

## 2023-10-12 MED ORDER — MIRTAZAPINE 15 MG PO TABS: 15.0000 mg | ORAL_TABLET | Freq: Every day | ORAL | 5 refills | Status: DC

## 2023-10-12 MED ORDER — SERTRALINE HCL 100 MG PO TABS: 200.0000 mg | ORAL_TABLET | Freq: Every day | ORAL | 11 refills | Status: DC

## 2023-11-04 ENCOUNTER — Other Ambulatory Visit (INDEPENDENT_AMBULATORY_CARE_PROVIDER_SITE_OTHER): Payer: Self-pay | Admitting: Pediatrics

## 2023-11-04 NOTE — Progress Notes (Signed)
 Patient: Roberto Ramirez MRN: [id number] Sex: male DOB: 1986-02-05  Provider: Marny Sires, MD Location of Care: [facility]  Note type: Routine follow-up   This is a Pediatric Specialist E-Visit follow up consult provided via MyChart Roberto Ramirez and their parent/guardian [facility] Ramirez consented to an E-Visit consult today.  Location of patient: [name] is at home with  Location of provider: Marny Sires, MD [facility]  The following participants were involved in this E-Visit:  Roberto Sires, MD, Roberto Ramirez, CMA, Roberto Ramirez, Scribe, Roberto Ramirez, patient, and their parent/guardian Roberto Ramirez  This visit was done via VIDEO    History of Present Illness:  Roberto Ramirez is a 38 y.o. male with history of autism, severe anxiety, and seizures who I am seeing for routine follow-up. Patient was last seen on 09/24/2023 where I continued medications, planned to consider Buspar as next medication, discontinued Duocal, continued [facility], recommended a compression vest or weighted vest daily, recommended slowly getting back into the community, provided resources from [employer] for building routines, and referred to OT. Since the last appointment, patient has started OT.   Patient presents today with guardian who reports the following:    Still gaining weight, still taking periactin , guardian reports his appetite is good and he is eating a lot  Anxiety is under better control, he would move around a lot when anxious so not using as many calories  Constipation is around the same  No seizures  Past Medical History Past Medical History:  Diagnosis Date   Abnormal gait    Anxiety    Autism spectrum disorder requiring very substantial support (level 3)        Constipation    Depression    Non-verbal learning disorder    Seasonal allergies    Seizures (HCC)    last seizure yrs ago in high school, none as  adult controlled with meds   Uses wheelchair    as needed when pt goes shoppjng. medical appts, trips,  Patient can walk but abn gait.    Surgical History Past Surgical History:  Procedure Laterality Date   CIRCUMCISION  01/31/88   RADIOLOGY WITH ANESTHESIA N/A 11/22/2019   Procedure: MRI WITH ANESTHESIA BRAIN WITH AND WIHTOUT;  Surgeon: Radiologist, Medication, MD;  Location: [facility] OR;  Service: Radiology;  Laterality: N/A;   RADIOLOGY WITH ANESTHESIA N/A 03/20/2022   Procedure: MRI WITH ANESTHESIA-CERVICAL SPINE W/O CONTRAST- THORACIC SPINE W/O CONTRAST- LUMBAR SPINE W/O CONTRAST;  Surgeon: Radiologist, Medication, MD;  Location: [facility] OR;  Service: Radiology;  Laterality: N/A;    Family History family history includes Cancer in his maternal grandmother; Lung cancer in his paternal grandfather.   Social History Social History   Social History Narrative   Roberto Ramirez is a 38 y.o. male.   He attends [employer] in [place].   He lives with his parents. He has two siblings.    Allergies No Known Allergies  Medications Current Outpatient Medications on File Prior to Visit  Medication Sig Dispense Refill   carbamazepine  (CARBATROL ) 300 MG 12 hr capsule TAKE TWO (2) CAPSULES BY MOUTH TWICE A DAY 120 capsule 11   cetirizine (ZYRTEC) 10 MG tablet Take 10 mg by mouth daily.     clonazePAM  (KLONOPIN ) 1 MG tablet Take 1 mg by mouth 3 (three) times daily.     cloNIDine  (CATAPRES ) 0.1 MG tablet TAKE ONE TABLET BY MOUTH AT 12PM, 3PM,AND 9PM 90 tablet 5  fluticasone (FLONASE) 50 MCG/ACT nasal spray Place 2 sprays into both nostrils daily.     ketoconazole (NIZORAL) 2 % cream Apply 1 application  topically 2 (two) times daily as needed for irritation.     linaclotide  (LINZESS ) 290 MCG CAPS capsule Take 1 capsule (290 mcg total) by mouth daily. 30 capsule 11   mirtazapine  (REMERON ) 15 MG tablet Take 1 tablet (15 mg total) by mouth at bedtime. 30 tablet 5   polyethylene glycol (MIRALAX / GLYCOLAX)  packet Take 17 g by mouth daily at 6 PM.      pseudoephedrine (SUDAFED) 120 MG 12 hr tablet Take 120 mg by mouth every 12 (twelve) hours as needed for congestion.     senna (SENOKOT) 8.6 MG TABS tablet 1 tablet daily.  May give second dose for no stool daily. 120 tablet 11   Nutritional Supplements (NUTRITIONAL SUPPLEMENT PLUS) LIQD 1 [facility] Standard 1.0 given PO daily. 10075 mL 12   No current facility-administered medications on file prior to visit.   The medication list was reviewed and reconciled. All changes or newly prescribed medications were explained.  A complete medication list was provided to the patient/caregiver.  Physical Exam There were no vitals taken for this visit. Facility age limit for growth %iles is 20 years.  No results found. Exam limited due to virtual visit General: NAD, well nourished  HEENT: normocephalic, no eye or nose discharge.  MMM  Cardiovascular: appears well perfused Lungs: Normal work of breathing Neuro: Awake, alert, face symmetric. Nonverbal.    Diagnosis: 1. Anorexia   2. Autism spectrum disorder with accompanying intellectual impairment, requiring substantial support (level 2)   3. Partial epilepsy with impairment of consciousness (HCC)   4. Anxiety state      Assessment and Plan Roberto Ramirez is a 38 y.o. male with history of autism, severe anxiety, and seizures who I am seeing in follow-up. Patient overall doing well with seizures, anxiety.  He has gained significant weight on cyproheptadine .  I suspect weight gai also related to improved anxiety.    Decrease Cyproheptadine  to once per day in the evening before dinner. We will send an order to [email or web address] Continue [place], 1 container daily Continue other medications Referred to the dietician to discuss weight stability Continue Carbamezapine for seizures Continue Zoloft  for anxiety Continue Remeron , CLondine for agitation and sleep  I spent 40 minutes on day of  service on this patient including review of chart, discussion with patient and family, discussion of screening results, coordination with other providers and management of orders and paperwork.     Return in about 3 months (around 02/11/2024).  Roberto Sires MD MPH Neurology and Neurodevelopment [facility] Neurology  [address] [place], [place], [place] [address] Phone: [phone] Fax: [phone]

## 2023-11-12 ENCOUNTER — Encounter (INDEPENDENT_AMBULATORY_CARE_PROVIDER_SITE_OTHER): Payer: Self-pay | Admitting: Pediatrics

## 2023-11-12 ENCOUNTER — Telehealth (INDEPENDENT_AMBULATORY_CARE_PROVIDER_SITE_OTHER): Payer: MEDICAID | Admitting: Pediatrics

## 2023-11-12 DIAGNOSIS — F411 Generalized anxiety disorder: Secondary | ICD-10-CM | POA: Diagnosis not present

## 2023-11-12 DIAGNOSIS — R630 Anorexia: Secondary | ICD-10-CM

## 2023-11-12 DIAGNOSIS — G40209 Localization-related (focal) (partial) symptomatic epilepsy and epileptic syndromes with complex partial seizures, not intractable, without status epilepticus: Secondary | ICD-10-CM

## 2023-11-12 DIAGNOSIS — F840 Autistic disorder: Secondary | ICD-10-CM | POA: Diagnosis not present

## 2023-11-12 NOTE — Patient Instructions (Addendum)
 Decrease Cyproheptadine  to once per day in the evening before dinner. We will send an order to [email or web address] Continue other medications Referred to the dietician to discuss weight stability

## 2023-11-13 MED ORDER — CYPROHEPTADINE HCL 4 MG PO TABS: 2.0000 mg | ORAL_TABLET | Freq: Every day | ORAL | 11 refills | Status: DC

## 2023-11-13 MED ORDER — SERTRALINE HCL 100 MG PO TABS: 200.0000 mg | ORAL_TABLET | Freq: Every day | ORAL | 11 refills | Status: AC

## 2023-12-07 ENCOUNTER — Other Ambulatory Visit (INDEPENDENT_AMBULATORY_CARE_PROVIDER_SITE_OTHER): Payer: Self-pay | Admitting: Pediatrics

## 2023-12-13 ENCOUNTER — Encounter (INDEPENDENT_AMBULATORY_CARE_PROVIDER_SITE_OTHER): Payer: Self-pay | Admitting: Pediatrics

## 2024-02-17 ENCOUNTER — Other Ambulatory Visit (INDEPENDENT_AMBULATORY_CARE_PROVIDER_SITE_OTHER): Payer: Self-pay | Admitting: Family

## 2024-02-17 DIAGNOSIS — R630 Anorexia: Secondary | ICD-10-CM

## 2024-02-17 MED ORDER — CYPROHEPTADINE HCL 4 MG PO TABS: ORAL_TABLET | ORAL | 11 refills | Status: DC

## 2024-03-07 ENCOUNTER — Other Ambulatory Visit (INDEPENDENT_AMBULATORY_CARE_PROVIDER_SITE_OTHER): Payer: Self-pay | Admitting: Pediatrics

## 2024-04-20 NOTE — Progress Notes (Signed)
 Patient: Roberto Ramirez MRN: [id number] Sex: male DOB: 08-Mar-1986  Provider: Corean Geralds, MD Location of Care: [facility]  Note type: Routine follow-up  History of Present Illness:  Roberto Ramirez is a 38 y.o. male with history of autism, severe anxiety, and seizures who I am seeing for routine follow-up. Patient was last seen on 11/12/2023 where I decreased cyproheptadine , continued his [facility], and continued his other medications.  Since the last appointment, there are no relevant appointments in the chart.   Patient presents today with guardian who reports the following:    Never got the updated prescription for cyproheptadine  so he has been getting it twice per day. He is not sedated on it.   Caregiver thinks that his weight and appetite are doing well. He is outgrowing his clothes. They never got a call from the dietician, caregiver does not feel he needs it at this time. He gets [employer] each day, he likes it. He is not a picky eater. GI said he did not need to follow up.   His mood is doing well. He tries to sit down in public, but his caregiver stops him and he is able to stay up. When he is anxious, he sits down on the ground after rocking back and forth.   He is sleeping well. No seizures.   He has not been complaining of pain since starting Naproxen .   Past Medical History Past Medical History:  Diagnosis Date   Abnormal gait    Anxiety    Autism spectrum disorder requiring very substantial support (level 3)        Constipation    Depression    Non-verbal learning disorder    Seasonal allergies    Seizures (HCC)    last seizure yrs ago in high school, none as adult controlled with meds   Uses wheelchair    as needed when pt goes shoppjng. medical appts, trips,  Patient can walk but abn gait.    Surgical History Past Surgical History:  Procedure Laterality Date   CIRCUMCISION  1989   RADIOLOGY WITH ANESTHESIA N/A  11/22/2019   Procedure: MRI WITH ANESTHESIA BRAIN WITH AND WIHTOUT;  Surgeon: Radiologist, Medication, MD;  Location: [facility] OR;  Service: Radiology;  Laterality: N/A;   RADIOLOGY WITH ANESTHESIA N/A 03/20/2022   Procedure: MRI WITH ANESTHESIA-CERVICAL SPINE W/O CONTRAST- THORACIC SPINE W/O CONTRAST- LUMBAR SPINE W/O CONTRAST;  Surgeon: Radiologist, Medication, MD;  Location: [facility] OR;  Service: Radiology;  Laterality: N/A;    Family History family history includes Cancer in his maternal grandmother; Lung cancer in his paternal grandfather.   Social History Social History   Social History Narrative   Roberto Ramirez is a 39 y.o. male.   He attends [employer] in [place].   He lives with his parents. He has two siblings.    Allergies No Known Allergies  Medications Current Outpatient Medications on File Prior to Visit  Medication Sig Dispense Refill   cetirizine (ZYRTEC) 10 MG tablet Take 10 mg by mouth daily.     clonazePAM  (KLONOPIN ) 1 MG tablet Take 1 mg by mouth 3 (three) times daily.     fluticasone (FLONASE) 50 MCG/ACT nasal spray Place 2 sprays into both nostrils daily.     ketoconazole (NIZORAL) 2 % cream Apply 1 application  topically 2 (two) times daily as needed for irritation.     linaclotide  (LINZESS ) 290 MCG CAPS capsule Take 1 capsule (290 mcg total) by  mouth daily. 30 capsule 11   Nutritional Supplements (NUTRITIONAL SUPPLEMENT PLUS) LIQD 1 [facility] Standard 1.0 given PO daily. 10075 mL 12   polyethylene glycol (MIRALAX / GLYCOLAX) packet Take 17 g by mouth daily at 6 PM.      pseudoephedrine (SUDAFED) 120 MG 12 hr tablet Take 120 mg by mouth every 12 (twelve) hours as needed for congestion.     senna (SENOKOT) 8.6 MG TABS tablet 1 tablet daily.  May give second dose for no stool daily. 120 tablet 11   sertraline  (ZOLOFT ) 100 MG tablet Take 2 tablets (200 mg total) by mouth daily. 60 tablet 11   No current facility-administered medications on file prior to visit.   The  medication list was reviewed and reconciled. All changes or newly prescribed medications were explained.  A complete medication list was provided to the patient/caregiver.  Physical Exam BP 100/70 (BP Location: Right Arm, Patient Position: Sitting, Cuff Size: Normal)   Wt 133 lb (60.3 kg)   BMI 20.07 kg/m  Facility age limit for growth %iles is 20 years.  No results found. General: NAD, well nourished  HEENT: normocephalic, no eye or nose discharge.  MMM  Cardiovascular: warm and well perfused Lungs: Normal work of breathing, no rhonchi or stridor Skin: No birthmarks, no skin breakdown Abdomen: soft, non tender, non distended Extremities: No contractures or edema. W sitting on the floor.  Neuro: Awake and alert, minimal engagement with examiner.  EOM intact, face symmetric. Moves all extremities equally and at least antigravity. No abnormal movements. Normal gait.       Diagnosis: 1. Anorexia   2. Autism spectrum disorder with accompanying intellectual impairment, requiring substantial support (level 2)      Assessment and Plan Roberto Ramirez is a 38 y.o. male with history of autism, severe anxiety, and seizures who I am seeing in follow-up. Patient is doing well with seizures and anxiety. Patient's weight and appetite is doing well so decreased cyproheptadine  and can consider stopping [facility] at next appointment. Reviewed medications.   Decrease cyproheptadine  to 1/2 tablet per day before breakfast Continue Carbamezapine 600 mg BID for seizures Continue Zoloft  200 mg for anxiety Continue Remeron  15 mg, Clondine 0.1 mg TID for agitation and sleep Continue [employer]. We can consider stopping it at the next appointment.  I recommend discussing his Linzess  prescription with his PCP  I spent 40 minutes on day of service on this patient including review of chart, discussion with patient and family, discussion of screening results, coordination with other providers and management  of orders and paperwork. This time does not include does include any behavioral screenings, baclofen pump refills, or VNS interrogations.   Return in about 6 months (around 10/27/2024).  I, Earnie Brandy, scribed for and in the presence of Corean Geralds, MD at today's visit on 04/28/2024.  I, Corean Geralds MD MPH, personally performed the services described in this documentation, as scribed by Earnie Brandy in my presence on 04/28/2024 and it is accurate, complete, and reviewed by me.     Corean Geralds MD MPH Neurology and Neurodevelopment [facility]  [address] [place], [place], [place] [address] Phone: [phone] Fax: [phone]

## 2024-04-28 ENCOUNTER — Ambulatory Visit (INDEPENDENT_AMBULATORY_CARE_PROVIDER_SITE_OTHER): Payer: MEDICAID | Admitting: Pediatrics

## 2024-04-28 ENCOUNTER — Encounter (INDEPENDENT_AMBULATORY_CARE_PROVIDER_SITE_OTHER): Payer: Self-pay | Admitting: Pediatrics

## 2024-04-28 DIAGNOSIS — F840 Autistic disorder: Secondary | ICD-10-CM

## 2024-04-28 DIAGNOSIS — R630 Anorexia: Secondary | ICD-10-CM | POA: Diagnosis not present

## 2024-04-28 MED ORDER — NAPROXEN 500 MG PO TABS: ORAL_TABLET | ORAL | 2 refills | Status: AC

## 2024-04-28 MED ORDER — CYPROHEPTADINE HCL 4 MG PO TABS: ORAL_TABLET | ORAL | 11 refills | Status: AC

## 2024-04-28 MED ORDER — CLONIDINE HCL 0.1 MG PO TABS: ORAL_TABLET | ORAL | 5 refills | Status: AC

## 2024-04-28 MED ORDER — MIRTAZAPINE 15 MG PO TABS: 15.0000 mg | ORAL_TABLET | Freq: Every day | ORAL | 5 refills | Status: AC

## 2024-04-28 NOTE — Patient Instructions (Addendum)
 Decrease cyproheptadine  to 1/2 tablet per day before breakfast Continue other medications Continue Mallie Pinion. We can consider stopping it at the next appointment.  I recommend discussing his Linzess  prescription with his PCP

## 2024-06-06 ENCOUNTER — Other Ambulatory Visit (INDEPENDENT_AMBULATORY_CARE_PROVIDER_SITE_OTHER): Payer: Self-pay | Admitting: Pediatrics

## 2024-06-06 DIAGNOSIS — G40209 Localization-related (focal) (partial) symptomatic epilepsy and epileptic syndromes with complex partial seizures, not intractable, without status epilepticus: Secondary | ICD-10-CM

## 2024-06-13 ENCOUNTER — Encounter (INDEPENDENT_AMBULATORY_CARE_PROVIDER_SITE_OTHER): Payer: Self-pay | Admitting: Pediatrics

## 2024-11-03 ENCOUNTER — Ambulatory Visit (INDEPENDENT_AMBULATORY_CARE_PROVIDER_SITE_OTHER): Payer: MEDICAID | Admitting: Pediatrics
